# Patient Record
Sex: Male | Born: 1960 | Race: White | Hispanic: No | Marital: Single | Smoking: Current every day smoker
Health system: Southern US, Community
[De-identification: ages and names within clinical notes are randomized; demographics above are authoritative.]

## PROBLEM LIST (undated history)

## (undated) DIAGNOSIS — K219 Gastro-esophageal reflux disease without esophagitis: Secondary | ICD-10-CM

## (undated) DIAGNOSIS — R112 Nausea with vomiting, unspecified: Secondary | ICD-10-CM

## (undated) DIAGNOSIS — Z9889 Other specified postprocedural states: Secondary | ICD-10-CM

## (undated) HISTORY — PX: FRACTURE SURGERY: SHX138

---

## 2005-05-24 HISTORY — PX: URETHRAL STRICTURE DILATATION: SHX477

## 2020-06-12 ENCOUNTER — Emergency Department: Payer: Self-pay

## 2020-06-12 ENCOUNTER — Encounter: Payer: Self-pay | Admitting: Emergency Medicine

## 2020-06-12 ENCOUNTER — Inpatient Hospital Stay
Admission: EM | Admit: 2020-06-12 | Discharge: 2020-06-16 | DRG: 481 | Disposition: A | Discharge status: Home / Self Care | Payer: Self-pay | Attending: Surgery | Admitting: Surgery

## 2020-06-12 ENCOUNTER — Other Ambulatory Visit: Payer: Self-pay

## 2020-06-12 DIAGNOSIS — M1611 Unilateral primary osteoarthritis, right hip: Secondary | ICD-10-CM | POA: Diagnosis present

## 2020-06-12 DIAGNOSIS — Z79899 Other long term (current) drug therapy: Secondary | ICD-10-CM

## 2020-06-12 DIAGNOSIS — R509 Fever, unspecified: Secondary | ICD-10-CM

## 2020-06-12 DIAGNOSIS — D62 Acute posthemorrhagic anemia: Secondary | ICD-10-CM | POA: Diagnosis not present

## 2020-06-12 DIAGNOSIS — K219 Gastro-esophageal reflux disease without esophagitis: Secondary | ICD-10-CM | POA: Diagnosis present

## 2020-06-12 DIAGNOSIS — Z7902 Long term (current) use of antithrombotics/antiplatelets: Secondary | ICD-10-CM

## 2020-06-12 DIAGNOSIS — S72001A Fracture of unspecified part of neck of right femur, initial encounter for closed fracture: Secondary | ICD-10-CM

## 2020-06-12 DIAGNOSIS — S72141A Displaced intertrochanteric fracture of right femur, initial encounter for closed fracture: Principal | ICD-10-CM | POA: Diagnosis present

## 2020-06-12 DIAGNOSIS — Z419 Encounter for procedure for purposes other than remedying health state, unspecified: Secondary | ICD-10-CM

## 2020-06-12 DIAGNOSIS — W010XXA Fall on same level from slipping, tripping and stumbling without subsequent striking against object, initial encounter: Secondary | ICD-10-CM | POA: Diagnosis present

## 2020-06-12 DIAGNOSIS — Z79891 Long term (current) use of opiate analgesic: Secondary | ICD-10-CM

## 2020-06-12 DIAGNOSIS — Z20822 Contact with and (suspected) exposure to covid-19: Secondary | ICD-10-CM | POA: Diagnosis present

## 2020-06-12 DIAGNOSIS — F17210 Nicotine dependence, cigarettes, uncomplicated: Secondary | ICD-10-CM | POA: Diagnosis present

## 2020-06-12 HISTORY — DX: Gastro-esophageal reflux disease without esophagitis: K21.9

## 2020-06-12 HISTORY — DX: Nausea with vomiting, unspecified: Z98.890

## 2020-06-12 HISTORY — DX: Other specified postprocedural states: R11.2

## 2020-06-12 LAB — COMPREHENSIVE METABOLIC PANEL
ALT: 21 U/L (ref 0–44)
AST: 26 U/L (ref 15–41)
Albumin: 3.9 g/dL (ref 3.5–5.0)
Alkaline Phosphatase: 78 U/L (ref 38–126)
Anion gap: 13 (ref 5–15)
BUN: 19 mg/dL (ref 6–20)
CO2: 22 mmol/L (ref 22–32)
Calcium: 9.3 mg/dL (ref 8.9–10.3)
Chloride: 103 mmol/L (ref 98–111)
Creatinine, Ser: 0.88 mg/dL (ref 0.61–1.24)
GFR, Estimated: >60 mL/min (ref >60)
Glucose, Bld: 134 mg/dL — ABNORMAL HIGH (ref 70–99)
Potassium: 3.6 mmol/L (ref 3.5–5.1)
Sodium: 138 mmol/L (ref 135–145)
Total Bilirubin: 0.7 mg/dL (ref 0.3–1.2)
Total Protein: 7.4 g/dL (ref 6.5–8.1)

## 2020-06-12 LAB — CBC WITH DIFFERENTIAL/PLATELET
Abs Immature Granulocytes: 0.07 10*3/uL (ref 0.00–0.07)
Basophils Absolute: 0.1 10*3/uL (ref 0.0–0.1)
Basophils Relative: 0 %
Eosinophils Absolute: 0.4 10*3/uL (ref 0.0–0.5)
Eosinophils Relative: 3 %
HCT: 40.4 % (ref 39.0–52.0)
Hemoglobin: 13.5 g/dL (ref 13.0–17.0)
Immature Granulocytes: 1 %
Lymphocytes Relative: 17 %
Lymphs Abs: 2.3 10*3/uL (ref 0.7–4.0)
MCH: 31.3 pg (ref 26.0–34.0)
MCHC: 33.4 g/dL (ref 30.0–36.0)
MCV: 93.5 fL (ref 80.0–100.0)
Monocytes Absolute: 0.7 10*3/uL (ref 0.1–1.0)
Monocytes Relative: 5 %
Neutro Abs: 10.2 10*3/uL — ABNORMAL HIGH (ref 1.7–7.7)
Neutrophils Relative %: 74 %
Platelets: 279 10*3/uL (ref 150–400)
RBC: 4.32 MIL/uL (ref 4.22–5.81)
RDW: 12.1 % (ref 11.5–15.5)
WBC: 13.7 10*3/uL — ABNORMAL HIGH (ref 4.0–10.5)
nRBC: 0 % (ref 0.0–0.2)

## 2020-06-12 LAB — PROTIME-INR
INR: 1.1 (ref 0.8–1.2)
Prothrombin Time: 13.5 seconds (ref 11.4–15.2)

## 2020-06-12 LAB — SARS CORONAVIRUS 2 BY RT PCR (HOSPITAL ORDER, PERFORMED IN ~~LOC~~ HOSPITAL LAB): SARS Coronavirus 2: NEGATIVE

## 2020-06-12 MED ORDER — SODIUM CHLORIDE 0.9 % IV SOLN: INTRAVENOUS | Status: DC

## 2020-06-12 MED ORDER — ACETAMINOPHEN 325 MG PO TABS: 650.0000 mg | ORAL_TABLET | Freq: Four times a day (QID) | ORAL | Status: DC | PRN

## 2020-06-12 MED ORDER — ACETAMINOPHEN 650 MG RE SUPP: 650.0000 mg | Freq: Four times a day (QID) | RECTAL | Status: DC | PRN

## 2020-06-12 MED ORDER — OXYCODONE HCL 5 MG PO TABS: 5.0000 mg | ORAL_TABLET | ORAL | Status: DC | PRN

## 2020-06-12 MED ORDER — MAGNESIUM HYDROXIDE 400 MG/5ML PO SUSP: 30.0000 mL | Freq: Every day | ORAL | Status: DC | PRN

## 2020-06-12 MED ORDER — HYDROMORPHONE HCL 1 MG/ML IJ SOLN
1.0000 mg | Freq: Once | INTRAMUSCULAR | Status: AC
Start: 2020-06-12 — End: 2020-06-12
  Administered 2020-06-12: 1 mg via INTRAVENOUS
  Filled 2020-06-12: qty 1

## 2020-06-12 MED ORDER — FLEET ENEMA 7-19 GM/118ML RE ENEM: 1.0000 | ENEMA | Freq: Once | RECTAL | Status: DC | PRN

## 2020-06-12 MED ORDER — PANTOPRAZOLE SODIUM 40 MG IV SOLR
40.0000 mg | Freq: Every day | INTRAVENOUS | Status: DC
  Administered 2020-06-12: 40 mg via INTRAVENOUS
  Filled 2020-06-12: qty 40

## 2020-06-12 MED ORDER — CEFAZOLIN SODIUM-DEXTROSE 2-4 GM/100ML-% IV SOLN
2.0000 g | INTRAVENOUS | Status: AC
  Administered 2020-06-13: 2 g via INTRAVENOUS
  Filled 2020-06-12: qty 100

## 2020-06-12 MED ORDER — ACETAMINOPHEN 500 MG PO TABS
1000.0000 mg | ORAL_TABLET | Freq: Once | ORAL | Status: AC
  Administered 2020-06-12: 1000 mg via ORAL
  Filled 2020-06-12: qty 2

## 2020-06-12 MED ORDER — BISACODYL 10 MG RE SUPP
10.0000 mg | Freq: Every day | RECTAL | Status: DC | PRN
  Filled 2020-06-12: qty 1

## 2020-06-12 MED ORDER — DOCUSATE SODIUM 100 MG PO CAPS
100.0000 mg | ORAL_CAPSULE | Freq: Two times a day (BID) | ORAL | Status: DC
  Administered 2020-06-12: 100 mg via ORAL
  Filled 2020-06-12: qty 1

## 2020-06-12 MED ORDER — HYDROMORPHONE HCL 1 MG/ML IJ SOLN
0.5000 mg | INTRAMUSCULAR | Status: DC | PRN
  Administered 2020-06-12 – 2020-06-13 (×6): 1 mg via INTRAVENOUS
  Filled 2020-06-12 (×6): qty 1

## 2020-06-12 NOTE — ED Provider Notes (Addendum)
[facility] Emergency Department Provider Note  ____________________________________________   Event Date/Time   First MD Initiated Contact with Patient 06/12/20 1517     (approximate)  I have reviewed the triage vital signs and the nursing notes.   HISTORY  Chief Complaint Hip Pain   HPI Phillip Washington is a 60 y.o. male with a history of chronic right hip pain who presents for assessment of acute onset of right hip pain after he slipped while taking out some garbage earlier today.  Patient states he has extreme pain in his right hip and has been able to bear weight or move it.  He denies any other acute pain in his extremities chest abdomen head neck or pelvis.  He denies hitting his head or any LOC.  He denies taking blood thinners.  He states that otherwise he has been in his usual state health without any recent fevers, chills, cough, nausea, vomiting, diarrhea, dysuria, rash or other recent injuries or falls.  No other acute concerns at this time.         History reviewed. No pertinent past medical history.  There are no problems to display for this patient.   History reviewed. No pertinent surgical history.  Prior to Admission medications   Not on File    Allergies Patient has no known allergies.  History reviewed. No pertinent family history.  Social History Social History   Tobacco Use  . Smoking status: Current Every Day Smoker  . Smokeless tobacco: Never Used  Substance Use Topics  . Alcohol use: Yes  . Drug use: Not Currently    Review of Systems  Review of Systems  Constitutional: Negative for chills and fever.  HENT: Negative for sore throat.   Eyes: Negative for pain.  Respiratory: Negative for cough and stridor.   Cardiovascular: Negative for chest pain.  Gastrointestinal: Negative for vomiting.  Genitourinary: Negative for dysuria.  Musculoskeletal: Positive for joint pain ( R hip).  Skin: Negative for rash.   Neurological: Negative for seizures, loss of consciousness and headaches.  Psychiatric/Behavioral: Negative for suicidal ideas.  All other systems reviewed and are negative.     ____________________________________________   PHYSICAL EXAM:  VITAL SIGNS: ED Triage Vitals  Enc Vitals Group     BP 06/12/20 1509 137/86     Pulse Rate 06/12/20 1509 68     Resp 06/12/20 1509 20     Temp --      Temp Source 06/12/20 1509 Oral     SpO2 06/12/20 1509 99 %     Weight 06/12/20 1511 230 lb (104.3 kg)     Height 06/12/20 1511 6\' 1"  (1.854 m)     Head Circumference --      Peak Flow --      Pain Score 06/12/20 1510 10     Pain Loc --      Pain Edu? --      Excl. in GC? --    Vitals:   06/12/20 1509 06/12/20 1639  BP: 137/86 126/81  Pulse: 68 80  Resp: 20 18  SpO2: 99% 96%   Physical Exam Vitals and nursing note reviewed.  Constitutional:      Appearance: He is well-developed and well-nourished.  HENT:     Head: Normocephalic and atraumatic.  Eyes:     Conjunctiva/sclera: Conjunctivae normal.  Cardiovascular:     Rate and Rhythm: Normal rate and regular rhythm.     Heart sounds: No murmur heard.  Pulmonary:     Effort: Pulmonary effort is normal. No respiratory distress.     Breath sounds: Normal breath sounds.  Abdominal:     Palpations: Abdomen is soft.     Tenderness: There is no abdominal tenderness. There is no right CVA tenderness or left CVA tenderness.  Musculoskeletal:        General: No edema.     Cervical back: Neck supple.     Right hip: Bony tenderness present. Decreased range of motion. Decreased strength.  Skin:    General: Skin is warm and dry.  Neurological:     Mental Status: He is alert.  Psychiatric:        Mood and Affect: Mood and affect normal.     2+ bilateral radial and DP pulses.  Patient has full strength at the right knee and right foot as well as throughout the left lower extremity and bowel upper extremities.  No tenderness  step-offs deformities over the C/T/L-spine.  Head and neck chest and abdomen are unremarkable. ____________________________________________   LABS (all labs ordered are listed, but only abnormal results are displayed)  Labs Reviewed  CBC WITH DIFFERENTIAL/PLATELET - Abnormal; Notable for the following components:      Result Value   WBC 13.7 (*)    Neutro Abs 10.2 (*)    All other components within normal limits  COMPREHENSIVE METABOLIC PANEL - Abnormal; Notable for the following components:   Glucose, Bld 134 (*)    All other components within normal limits  SARS CORONAVIRUS 2 BY RT PCR (HOSPITAL ORDER, PERFORMED IN [facility])  PROTIME-INR  TYPE AND SCREEN   ____________________________________________  EKG  Sinus rhythm with a ventricular rate of 71, right axis, unremarkable intervals, some artifact but no clear evidence of acute ischemia or other significant underlying arrhythmia. ____________________________________________  RADIOLOGY  ED MD interpretation: Chest x-ray has no evidence of pneumonia, pneumothorax, effusion, edema, or other acute thoracic process.  Right hip plain film shows evidence of acute comminuted right intertrochanteric fracture.  Official radiology report(s): DG Chest 1 View  Result Date: 06/12/2020 CLINICAL DATA:  Fall EXAM: CHEST  1 VIEW COMPARISON:  None. FINDINGS: The heart size and mediastinal contours are within normal limits. Both lungs are clear. The visualized skeletal structures are unremarkable. IMPRESSION: No active disease. Electronically Signed   By: Jasmine Pang M.D.   On: 06/12/2020 16:48   DG Hip Unilat  With Pelvis 2-3 Views Right  Result Date: 06/12/2020 CLINICAL DATA:  Hip pain after fall EXAM: DG HIP (WITH OR WITHOUT PELVIS) 2-3V RIGHT COMPARISON:  None. FINDINGS: SI joints are non widened. Pubic symphysis and rami appear intact. Mild degenerative changes of the left hip. Acute comminuted right intertrochanteric  fracture with displaced lesser trochanteric fracture fragment. Advanced arthritis of the right hip. Right femoral head projects in joint IMPRESSION: Acute comminuted right intertrochanteric fracture. Electronically Signed   By: Jasmine Pang M.D.   On: 06/12/2020 16:48    ____________________________________________   PROCEDURES  Procedure(s) performed (including Critical Care):  .1-3 Lead EKG Interpretation Performed by: Gilles Chiquito, MD Authorized by: Gilles Chiquito, MD     Interpretation: normal     ECG rate assessment: normal     Rhythm: sinus rhythm     Ectopy: none     Conduction: normal       ____________________________________________   INITIAL IMPRESSION / ASSESSMENT AND PLAN / ED COURSE      Patient presents for assessment of acute  right hip pain after a ground-level mechanical fall described above.  On arrival he is hemodynamically stable.  He is very weak and tender at the right hip.  He is neurovascular intact distally.  No evidence on history or exam of acute infectious process or injury to any other joints.  Routine labs obtained show mild leukocytosis which I suspect is reactive in the setting of trauma.  Normal hemoglobin.  CMP shows no significant electrolyte or metabolic derangements.  INR is unremarkable.  Low suspicion for other occult or significant visceral injury at this time.  I did discuss with on-call orthopedist [name] he stated to admit the patient for further evaluation and management.  ____________________________________________   FINAL CLINICAL IMPRESSION(S) / ED DIAGNOSES  Final diagnoses:  Closed fracture of right hip, initial encounter (HCC)    Medications  HYDROmorphone (DILAUDID) injection 1 mg (1 mg Intravenous Given 06/12/20 1559)  acetaminophen (TYLENOL) tablet 1,000 mg (1,000 mg Oral Given 06/12/20 1652)     ED Discharge Orders    None       Note:  This document was prepared using Dragon voice recognition  software and may include unintentional dictation errors.   Gilles Chiquito, MD 06/12/20 1722    Gilles Chiquito, MD 06/12/20 [phone]

## 2020-06-12 NOTE — ED Triage Notes (Signed)
Pt comes into the ED via POV c/o right hip pain after slipping on ice while taking garbage out.  Pt has shortening noted to the leg at this time and is completely nonweightbearing. Pt has even and unlabored respirations.

## 2020-06-12 NOTE — ED Notes (Signed)
Pt given water to drink per request.  Readjusted in the bed with towel and pillows propping up patient to comfortable spot.

## 2020-06-12 NOTE — ED Notes (Signed)
Patient to xray at this time

## 2020-06-13 ENCOUNTER — Inpatient Hospital Stay: Payer: Self-pay

## 2020-06-13 ENCOUNTER — Inpatient Hospital Stay: Payer: Self-pay | Admitting: Anesthesiology

## 2020-06-13 ENCOUNTER — Other Ambulatory Visit: Payer: Self-pay

## 2020-06-13 ENCOUNTER — Encounter
Admission: EM | Disposition: A | Discharge status: Home / Self Care | Payer: Self-pay | Source: Home / Self Care | Attending: Surgery

## 2020-06-13 ENCOUNTER — Encounter: Payer: Self-pay | Admitting: Surgery

## 2020-06-13 HISTORY — PX: INTRAMEDULLARY (IM) NAIL INTERTROCHANTERIC: SHX5875

## 2020-06-13 LAB — HIV ANTIBODY (ROUTINE TESTING W REFLEX): HIV Screen 4th Generation wRfx: NONREACTIVE

## 2020-06-13 SURGERY — FIXATION, FRACTURE, INTERTROCHANTERIC, WITH INTRAMEDULLARY ROD
Anesthesia: Monitor Anesthesia Care | Site: Hip | Laterality: Right

## 2020-06-13 MED ORDER — PROPOFOL 500 MG/50ML IV EMUL
INTRAVENOUS | Status: DC | PRN
  Administered 2020-06-13: 75 ug/kg/min via INTRAVENOUS

## 2020-06-13 MED ORDER — ACETAMINOPHEN 10 MG/ML IV SOLN
INTRAVENOUS | Status: DC | PRN
  Administered 2020-06-13: 1000 mg via INTRAVENOUS

## 2020-06-13 MED ORDER — BUPIVACAINE HCL (PF) 0.5 % IJ SOLN
INTRAMUSCULAR | Status: AC
  Filled 2020-06-13: qty 10

## 2020-06-13 MED ORDER — NEOMYCIN-POLYMYXIN B GU 40-200000 IR SOLN
Status: AC
  Filled 2020-06-13: qty 4

## 2020-06-13 MED ORDER — LACTATED RINGERS IV SOLN
INTRAVENOUS | Status: DC
  Administered 2020-06-13: 100 mL/h via INTRAVENOUS

## 2020-06-13 MED ORDER — CEFAZOLIN SODIUM-DEXTROSE 2-4 GM/100ML-% IV SOLN
2.0000 g | Freq: Four times a day (QID) | INTRAVENOUS | Status: AC
  Administered 2020-06-14 (×2): 2 g via INTRAVENOUS
  Filled 2020-06-13 (×3): qty 100

## 2020-06-13 MED ORDER — ONDANSETRON HCL 4 MG/2ML IJ SOLN
INTRAMUSCULAR | Status: AC
  Filled 2020-06-13: qty 2

## 2020-06-13 MED ORDER — SENNOSIDES-DOCUSATE SODIUM 8.6-50 MG PO TABS: 1.0000 | ORAL_TABLET | Freq: Every evening | ORAL | Status: DC | PRN

## 2020-06-13 MED ORDER — BUPIVACAINE LIPOSOME 1.3 % IJ SUSP
INTRAMUSCULAR | Status: AC
  Filled 2020-06-13: qty 20

## 2020-06-13 MED ORDER — ACETAMINOPHEN 10 MG/ML IV SOLN
INTRAVENOUS | Status: AC
  Filled 2020-06-13: qty 100

## 2020-06-13 MED ORDER — DOCUSATE SODIUM 100 MG PO CAPS
100.0000 mg | ORAL_CAPSULE | Freq: Two times a day (BID) | ORAL | Status: DC
  Administered 2020-06-13 – 2020-06-16 (×6): 100 mg via ORAL
  Filled 2020-06-13 (×6): qty 1

## 2020-06-13 MED ORDER — CEFAZOLIN SODIUM-DEXTROSE 2-4 GM/100ML-% IV SOLN
INTRAVENOUS | Status: AC
  Administered 2020-06-13: 2 g via INTRAVENOUS
  Filled 2020-06-13: qty 100

## 2020-06-13 MED ORDER — HYDROMORPHONE HCL 1 MG/ML IJ SOLN
0.2500 mg | INTRAMUSCULAR | Status: DC | PRN
  Administered 2020-06-13 – 2020-06-14 (×3): 0.5 mg via INTRAVENOUS
  Filled 2020-06-13 (×3): qty 1

## 2020-06-13 MED ORDER — TRAMADOL HCL 50 MG PO TABS
50.0000 mg | ORAL_TABLET | Freq: Four times a day (QID) | ORAL | Status: DC | PRN
Start: 2020-06-13 — End: 2020-06-16
  Administered 2020-06-14: 50 mg via ORAL
  Filled 2020-06-13 (×2): qty 1

## 2020-06-13 MED ORDER — NEOMYCIN-POLYMYXIN B GU 40-200000 IR SOLN
Status: DC | PRN
Start: 2020-06-13 — End: 2020-06-13
  Administered 2020-06-13: 4 mL

## 2020-06-13 MED ORDER — BUPIVACAINE HCL (PF) 0.5 % IJ SOLN
INTRAMUSCULAR | Status: DC | PRN
  Administered 2020-06-13: 30 mL

## 2020-06-13 MED ORDER — BUPIVACAINE HCL (PF) 0.25 % IJ SOLN
INTRAMUSCULAR | Status: AC
  Filled 2020-06-13: qty 30

## 2020-06-13 MED ORDER — FENTANYL CITRATE (PF) 100 MCG/2ML IJ SOLN
INTRAMUSCULAR | Status: AC
  Filled 2020-06-13: qty 2

## 2020-06-13 MED ORDER — ONDANSETRON HCL 4 MG/2ML IJ SOLN: 4.0000 mg | Freq: Four times a day (QID) | INTRAMUSCULAR | Status: DC | PRN

## 2020-06-13 MED ORDER — METOCLOPRAMIDE HCL 5 MG/ML IJ SOLN: 5.0000 mg | Freq: Three times a day (TID) | INTRAMUSCULAR | Status: DC | PRN

## 2020-06-13 MED ORDER — ENOXAPARIN SODIUM 40 MG/0.4ML ~~LOC~~ SOLN
40.0000 mg | SUBCUTANEOUS | Status: DC
  Administered 2020-06-14 – 2020-06-16 (×2): 40 mg via SUBCUTANEOUS
  Filled 2020-06-13 (×2): qty 0.4

## 2020-06-13 MED ORDER — PHENYLEPHRINE HCL (PRESSORS) 10 MG/ML IV SOLN
INTRAVENOUS | Status: DC | PRN
  Administered 2020-06-13: 300 ug via INTRAVENOUS
  Administered 2020-06-13: 200 ug via INTRAVENOUS
  Administered 2020-06-13: 300 ug via INTRAVENOUS
  Administered 2020-06-13: 200 ug via INTRAVENOUS

## 2020-06-13 MED ORDER — OXYCODONE HCL 5 MG PO TABS
5.0000 mg | ORAL_TABLET | ORAL | Status: DC | PRN
  Administered 2020-06-14 – 2020-06-15 (×2): 5 mg via ORAL
  Administered 2020-06-15: 10 mg via ORAL
  Administered 2020-06-15: 5 mg via ORAL
  Administered 2020-06-15: 10 mg via ORAL
  Administered 2020-06-16: 5 mg via ORAL
  Filled 2020-06-13 (×3): qty 2
  Filled 2020-06-13 (×2): qty 1
  Filled 2020-06-13: qty 2

## 2020-06-13 MED ORDER — EPHEDRINE SULFATE 50 MG/ML IJ SOLN
INTRAMUSCULAR | Status: DC | PRN
  Administered 2020-06-13: 10 mg via INTRAVENOUS
  Administered 2020-06-13: 15 mg via INTRAVENOUS

## 2020-06-13 MED ORDER — MIDAZOLAM HCL 5 MG/5ML IJ SOLN
INTRAMUSCULAR | Status: DC | PRN
  Administered 2020-06-13: 2 mg via INTRAVENOUS

## 2020-06-13 MED ORDER — METHOCARBAMOL 500 MG PO TABS
500.0000 mg | ORAL_TABLET | Freq: Four times a day (QID) | ORAL | Status: DC | PRN
  Administered 2020-06-14 – 2020-06-15 (×2): 500 mg via ORAL
  Filled 2020-06-13 (×2): qty 1

## 2020-06-13 MED ORDER — PROPOFOL 500 MG/50ML IV EMUL
INTRAVENOUS | Status: AC
  Filled 2020-06-13: qty 50

## 2020-06-13 MED ORDER — ONDANSETRON HCL 4 MG/2ML IJ SOLN: 4.0000 mg | Freq: Once | INTRAMUSCULAR | Status: DC | PRN

## 2020-06-13 MED ORDER — FENTANYL CITRATE (PF) 100 MCG/2ML IJ SOLN
25.0000 ug | INTRAMUSCULAR | Status: DC | PRN
Start: 2020-06-13 — End: 2020-06-13
  Administered 2020-06-13 (×2): 50 ug via INTRAVENOUS

## 2020-06-13 MED ORDER — MEPERIDINE HCL 50 MG/ML IJ SOLN: 6.2500 mg | INTRAMUSCULAR | Status: DC | PRN

## 2020-06-13 MED ORDER — MIDAZOLAM HCL 2 MG/2ML IJ SOLN
INTRAMUSCULAR | Status: AC
  Filled 2020-06-13: qty 2

## 2020-06-13 MED ORDER — OXYCODONE HCL 5 MG PO TABS
2.5000 mg | ORAL_TABLET | ORAL | Status: DC | PRN
  Administered 2020-06-14 – 2020-06-16 (×2): 5 mg via ORAL
  Filled 2020-06-13 (×2): qty 1

## 2020-06-13 MED ORDER — EPHEDRINE 5 MG/ML INJ
INTRAVENOUS | Status: AC
  Filled 2020-06-13: qty 10

## 2020-06-13 MED ORDER — FLEET ENEMA 7-19 GM/118ML RE ENEM: 1.0000 | ENEMA | Freq: Once | RECTAL | Status: DC | PRN

## 2020-06-13 MED ORDER — HYDROMORPHONE HCL 1 MG/ML IJ SOLN
INTRAMUSCULAR | Status: AC
  Filled 2020-06-13: qty 1

## 2020-06-13 MED ORDER — PROPOFOL 10 MG/ML IV BOLUS
INTRAVENOUS | Status: DC | PRN
  Administered 2020-06-13: 40 mg via INTRAVENOUS

## 2020-06-13 MED ORDER — KETOROLAC TROMETHAMINE 15 MG/ML IJ SOLN
15.0000 mg | Freq: Four times a day (QID) | INTRAMUSCULAR | Status: AC
  Administered 2020-06-13 – 2020-06-14 (×3): 15 mg via INTRAVENOUS
  Filled 2020-06-13 (×3): qty 1

## 2020-06-13 MED ORDER — ONDANSETRON HCL 4 MG PO TABS: 4.0000 mg | ORAL_TABLET | Freq: Four times a day (QID) | ORAL | Status: DC | PRN

## 2020-06-13 MED ORDER — METOCLOPRAMIDE HCL 10 MG PO TABS: 5.0000 mg | ORAL_TABLET | Freq: Three times a day (TID) | ORAL | Status: DC | PRN

## 2020-06-13 MED ORDER — SODIUM CHLORIDE 0.9 % IV SOLN
INTRAVENOUS | Status: DC | PRN
  Administered 2020-06-13: 50 ug/min via INTRAVENOUS

## 2020-06-13 MED ORDER — BUPIVACAINE HCL (PF) 0.5 % IJ SOLN
INTRAMUSCULAR | Status: AC
  Filled 2020-06-13: qty 30

## 2020-06-13 MED ORDER — BUPIVACAINE HCL (PF) 0.5 % IJ SOLN
INTRAMUSCULAR | Status: DC | PRN
  Administered 2020-06-13: 3 mL via INTRATHECAL

## 2020-06-13 MED ORDER — METHOCARBAMOL 1000 MG/10ML IJ SOLN
500.0000 mg | Freq: Four times a day (QID) | INTRAVENOUS | Status: DC | PRN
  Filled 2020-06-13: qty 5

## 2020-06-13 MED ORDER — BUPIVACAINE LIPOSOME 1.3 % IJ SUSP
INTRAMUSCULAR | Status: DC | PRN
  Administered 2020-06-13: 20 mL

## 2020-06-13 MED ORDER — BISACODYL 10 MG RE SUPP: 10.0000 mg | Freq: Every day | RECTAL | Status: DC | PRN

## 2020-06-13 MED ORDER — ACETAMINOPHEN 500 MG PO TABS
1000.0000 mg | ORAL_TABLET | Freq: Three times a day (TID) | ORAL | Status: DC
  Administered 2020-06-13 – 2020-06-16 (×8): 1000 mg via ORAL
  Filled 2020-06-13 (×8): qty 2

## 2020-06-13 MED ORDER — SODIUM CHLORIDE 0.9 % IV SOLN: INTRAVENOUS | Status: DC

## 2020-06-13 SURGICAL SUPPLY — 49 items
BIT DRILL INTERTAN LAG SCREW (BIT) ×1 IMPLANT
BIT DRILL SHORT 4.0 (BIT) IMPLANT
BNDG COHESIVE 4X5 TAN STRL (GAUZE/BANDAGES/DRESSINGS) ×2 IMPLANT
BNDG COHESIVE 6X5 TAN STRL LF (GAUZE/BANDAGES/DRESSINGS) ×2 IMPLANT
CANISTER SUCT 1200ML W/VALVE (MISCELLANEOUS) ×2 IMPLANT
CHLORAPREP W/TINT 26 (MISCELLANEOUS) ×3 IMPLANT
COVER WAND RF STERILE (DRAPES) ×2 IMPLANT
DRAPE 3/4 80X56 (DRAPES) ×3 IMPLANT
DRAPE C-ARMOR (DRAPES) ×2 IMPLANT
DRILL BIT SHORT 4.0 (BIT) ×1
DRSG OPSITE POSTOP 3X4 (GAUZE/BANDAGES/DRESSINGS) IMPLANT
DRSG OPSITE POSTOP 4X6 (GAUZE/BANDAGES/DRESSINGS) ×3 IMPLANT
ELECT CAUTERY BLADE 6.4 (BLADE) ×2 IMPLANT
ELECT REM PT RETURN 9FT ADLT (ELECTROSURGICAL) ×2
ELECTRODE REM PT RTRN 9FT ADLT (ELECTROSURGICAL) ×1 IMPLANT
GAUZE SPONGE 4X4 12PLY STRL (GAUZE/BANDAGES/DRESSINGS) ×2 IMPLANT
GLOVE BIO SURGEON STRL SZ8 (GLOVE) ×4 IMPLANT
GLOVE INDICATOR 8.0 STRL GRN (GLOVE) ×2 IMPLANT
GOWN STRL REUS W/ TWL LRG LVL3 (GOWN DISPOSABLE) ×1 IMPLANT
GOWN STRL REUS W/ TWL XL LVL3 (GOWN DISPOSABLE) ×1 IMPLANT
GOWN STRL REUS W/TWL LRG LVL3 (GOWN DISPOSABLE) ×1
GOWN STRL REUS W/TWL XL LVL3 (GOWN DISPOSABLE) ×1
GUIDE PIN 3.2X343 (PIN) ×2
GUIDE PIN 3.2X343MM (PIN) ×2
GUIDE ROD 3.0 (MISCELLANEOUS) ×2
HOLDER FOLEY CATH W/STRAP (MISCELLANEOUS) IMPLANT
MANIFOLD NEPTUNE II (INSTRUMENTS) ×2 IMPLANT
MAT ABSORB  FLUID 56X50 GRAY (MISCELLANEOUS) ×1
MAT ABSORB FLUID 56X50 GRAY (MISCELLANEOUS) ×1 IMPLANT
NAIL LOCK CANN 10X420 130D RT (Nail) ×1 IMPLANT
NDL FILTER BLUNT 18X1 1/2 (NEEDLE) ×1 IMPLANT
NEEDLE FILTER BLUNT 18X 1/2SAF (NEEDLE) ×1
NEEDLE FILTER BLUNT 18X1 1/2 (NEEDLE) ×1 IMPLANT
NEEDLE HYPO 22GX1.5 SAFETY (NEEDLE) ×2 IMPLANT
NS IRRIG 500ML POUR BTL (IV SOLUTION) ×2 IMPLANT
PACK HIP COMPR (MISCELLANEOUS) ×2 IMPLANT
PIN GUIDE 3.2X343MM (PIN) IMPLANT
ROD GUIDE 3.0 (MISCELLANEOUS) IMPLANT
SCREW LAG COMPR KIT 100/95 (Screw) ×1 IMPLANT
SCREW TRIGEN LOW PROF 5.0X42.5 (Screw) ×1 IMPLANT
STAPLER SKIN PROX 35W (STAPLE) ×2 IMPLANT
STRAP SAFETY 5IN WIDE (MISCELLANEOUS) ×2 IMPLANT
SUT VIC AB 0 CT1 36 (SUTURE) ×2 IMPLANT
SUT VIC AB 1 CT1 36 (SUTURE) ×2 IMPLANT
SUT VIC AB 2-0 CT1 (SUTURE) ×3 IMPLANT
SYR 10ML LL (SYRINGE) ×2 IMPLANT
SYR 30ML LL (SYRINGE) ×2 IMPLANT
TAPE MICROFOAM 4IN (TAPE) ×2 IMPLANT
TRAY FOLEY SLVR 16FR LF STAT (SET/KITS/TRAYS/PACK) IMPLANT

## 2020-06-13 NOTE — Anesthesia Preprocedure Evaluation (Signed)
Anesthesia Evaluation  Patient identified by MRN, date of birth, ID band Patient awake    Reviewed: Allergy & Precautions, H&P , NPO status , Patient's Chart, lab work & pertinent test results  Airway Mallampati: II  TM Distance: >3 FB Neck ROM: Full    Dental  (+) Poor Dentition   Pulmonary neg pulmonary ROS, Current Smoker and Patient abstained from smoking.,    Pulmonary exam normal        Cardiovascular negative cardio ROS Normal cardiovascular exam     Neuro/Psych negative neurological ROS  negative psych ROS   GI/Hepatic negative GI ROS, Neg liver ROS,   Endo/Other  negative endocrine ROS  Renal/GU negative Renal ROS  negative genitourinary   Musculoskeletal negative musculoskeletal ROS (+)   Abdominal   Peds negative pediatric ROS (+)  Hematology negative hematology ROS (+)   Anesthesia Other Findings   Reproductive/Obstetrics negative OB ROS                            Anesthesia Physical Anesthesia Plan  ASA: II  Anesthesia Plan: MAC and Spinal   Post-op Pain Management:    Induction: Intravenous  PONV Risk Score and Plan: 2  Airway Management Planned: Mask  Additional Equipment:   Intra-op Plan:   Post-operative Plan:   Informed Consent: I have reviewed the patients History and Physical, chart, labs and discussed the procedure including the risks, benefits and alternatives for the proposed anesthesia with the patient or authorized representative who has indicated his/her understanding and acceptance.       Plan Discussed with: CRNA and Surgeon  Anesthesia Plan Comments:         Anesthesia Quick Evaluation

## 2020-06-13 NOTE — H&P (Signed)
H&P reviewed. No significant changes noted. I will be taking over care of this patient from [name], and I will plan to perform R hip intramedullary nailing.

## 2020-06-13 NOTE — H&P (Signed)
Subjective:  Chief complaint: Right hip pain.  The patient is a 60 y.o. male who sustained an injury to the right hip yesterday afternoon.  Apparently he was taking the garbage out to the dumpster when he slipped on a patch of black ice and landed on his right hip.  He noted immediate pain and was unable to ambulate.  He was brought to the emergency room where x-rays demonstrated a displaced intertrochanteric fracture of the right hip.  The patient denies any associated injury. The patient did not strike his head or lose consciousness. The patient also denies any light-headedness, dizziness, chest pain, or shortness of breath which might have contributed to the injury.  The patient works as a Corporate investment banker.  He does note that he has had progressively worsening pain in his right hip for several years and has been diagnosed with degenerative joint disease.  In fact, he was scheduled to see an orthopedic surgeon at [facility] on Monday to discuss hip replacement surgery.  Patient Active Problem List   Diagnosis Date Noted  . Intertrochanteric fracture of right hip (HCC) 06/12/2020   History reviewed. No pertinent past medical history.  History reviewed. No pertinent surgical history.  (Not in a hospital admission)  No Known Allergies  Social History   Tobacco Use  . Smoking status: Current Every Day Smoker  . Smokeless tobacco: Never Used  Substance Use Topics  . Alcohol use: Yes    History reviewed. No pertinent family history.   Review of Systems: As noted above. The patient denies any chest pain, shortness of breath, nausea, vomiting, diarrhea, constipation, belly pain, blood in his/her stool, or burning with urination.  Objective: Temp:  [98 F (36.7 C)] 98 F (36.7 C) (01/21 0728) Pulse Rate:  [66-92] 67 (01/21 0728) Resp:  [14-20] 16 (01/21 0728) BP: (100-137)/(62-86) 118/72 (01/21 0728) SpO2:  [92 %-99 %] 96 % (01/21 0728) Weight:  [104.3 kg] 104.3 kg (01/20 1511)  Physical  Exam: General:  Alert, no acute distress Psychiatric:  Patient is competent for consent with normal mood and affect Cardiovascular:  RRR  Respiratory:  Clear to auscultation. No wheezing. Non-labored breathing GI:  Abdomen is soft and non-tender Skin:  No lesions in the area of chief complaint Neurologic:  Sensation intact distally Lymphatic:  No axillary or cervical lymphadenopathy  Orthopedic Exam:  Orthopedic examination is limited to the right hip and lower extremity.  The right lower extremity somewhat shortened and externally rotated as compared to the left.  Skin inspection around the right hip is unremarkable.  No swelling, erythema, ecchymosis, abrasions, or other skin abnormalities are identified.  He has some mild tenderness to palpation over the lateral aspect of the right hip.  He has more severe pain with any attempted active or passive motion of the hip.  He is able to dorsiflex and plantarflex his toes and ankle.  Sensation is intact to light touch to all distributions.  He has good capillary refill to his right foot.  Imaging Review: Recent x-rays of the pelvis and right hip are available for review and have been reviewed by myself.  These films demonstrate a mildly displaced three-part intertrochanteric fracture of the right hip.  There also is evidence of at least moderate degenerative changes of the right hip as manifest by superior joint space narrowing and femoral osteophyte formation.  No lytic lesions or other acute bony abnormalities are identified.  Assessment: Closed displaced intertrochanteric right hip fracture with underlying degenerative joint disease.  Plan: The treatment options, including both surgical and nonsurgical choices, have been discussed in detail with the patient and his wife. The patient would like to proceed with surgical intervention to include an intramedullary nailing of the right hip fracture. I explained to the patient that we need to get this  fracture to heal first before he can start to think about getting a hip replacement. The risks (including bleeding, infection, nerve and/or blood vessel injury, persistent or recurrent pain, malunion and/or nonunion, leg length inequality, persistent/worsening degenerative joint disease, need for further surgery, blood clots, strokes, heart attacks or arrhythmias, pneumonia, etc.) and benefits of the surgical procedure were discussed. The patient states his understanding and agrees to proceed. A formal written consent will be obtained by the nursing staff.

## 2020-06-13 NOTE — Transfer of Care (Signed)
Immediate Anesthesia Transfer of Care Note  Patient: Phillip Washington  Procedure(s) Performed: INTRAMEDULLARY (IM) NAIL INTERTROCHANTRIC (Right Hip)  Patient Location: PACU  Anesthesia Type:Spinal  Level of Consciousness: awake, alert  and oriented  Airway & Oxygen Therapy: Patient Spontanous Breathing and Patient connected to face mask oxygen  Post-op Assessment: Report given to RN and Post -op Vital signs reviewed and stable  Post vital signs: Reviewed and stable  Last Vitals:  Vitals Value Taken Time  BP    Temp    Pulse    Resp    SpO2      Last Pain:  Vitals:   06/13/20 1241  TempSrc:   PainSc: 8          Complications: No complications documented.

## 2020-06-13 NOTE — Anesthesia Procedure Notes (Signed)
Spinal  Patient location during procedure: OR Start time: 06/13/2020 1:34 PM End time: 06/13/2020 1:42 PM Staffing Performed: resident/CRNA  Anesthesiologist: Manfred Arch, MD Resident/CRNA: Lynden Oxford, CRNA Preanesthetic Checklist Completed: patient identified, IV checked, site marked, risks and benefits discussed, surgical consent, monitors and equipment checked, pre-op evaluation and timeout performed Spinal Block Patient position: sitting Prep: DuraPrep Patient monitoring: heart rate, cardiac monitor, continuous pulse ox and blood pressure Approach: midline Location: L3-4 Injection technique: single-shot Needle Needle type: Pencan  Needle gauge: 25 G Needle length: 9 cm Assessment Sensory level: T4

## 2020-06-13 NOTE — Op Note (Signed)
DATE OF SURGERY: 06/13/2020  PREOPERATIVE DIAGNOSIS: Right intertrochanteric hip fracture  POSTOPERATIVE DIAGNOSIS: Right intertrochanteric hip fracture  PROCEDURE: Intramedullary nailing of Right femur with cephalomedullary device  SURGEON: Rosealee Albee, MD  ANESTHESIA: spinal  EBL: 150 cc  IVF: per anesthesia record  COMPONENTS:  [employer] Intertan Long Nail: 10x431mm; lag screw with 29mm compression screw; 5x 42.84mm distal cortical interlocking screw  INDICATIONS: Phillip Washington is a 60 y.o. male who sustained an intertrochanteric fracture after a fall. Risks and benefits of intramedullary nailing were explained to the patient and/or family . Risks include but are not limited to bleeding, infection, injury to tissues, nerves, vessels, nonunion/malunion, hardware failure, limb length discrepancy/hip rotation mismatch and risks of anesthesia. The patient and/or family understand these risks, have completed an informed consent, and wish to proceed. Of note, he has end-stage degenerative changes to the right hip joint and he was considering undergoing total hip arthroplasty prior to injury. We did discuss that his fracture would ideally heal prior to undergoing THA.    PROCEDURE:  The patient was brought into the operating room. After administering anesthesia, the patient was placed in the supine position on the Hana table. The uninjured leg was placed in an extended position while the injured lower extremity was placed in longitudinal traction. The fracture was reduced using longitudinal traction and internal rotation. The adequacy of reduction was verified fluoroscopically in AP and lateral projections and found to be acceptable on the lateral view but slightly displaced on the AP view. The lateral aspect of the hip and thigh were prepped with ChloraPrep solution before being draped sterilely. Preoperative IV antibiotics were administered. A timeout was performed to verify the  appropriate surgical site, patient, and procedure.    The greater trochanter was identified and an approximately 6 cm incision was made about 3 fingerbreadths above the tip of the greater trochanter. The incision was carried down through the subcutaneous tissues to expose the gluteal fascia. This was split the length of the incision, providing access to the tip of the trochanter. Under fluoroscopic guidance, a guidewire was drilled through the tip of the trochanter into the proximal metaphysis to the level of the lesser trochanter. After verifying its position fluoroscopically in AP and lateral projections, it was overreamed with the opening reamer to the level of the lesser trochanter. A guidewire was passed down through the femoral canal to the supracondylar region.  Next, on incision distal to the vastus ridge on the lateral aspect of the femur was made.  IT band was split sharply as well as the vastus lateralis.  A bone hook was placed along the medial aspect of the femoral neck fragment and pulled laterally.  This achieved improved medial calcar overlap.  Next, while holding the fracture in this reduced position, the guidewire was overreamed sequentially using the flexible reamers. The nail was selected and advanced to the appropriate depth as verified fluoroscopically.    The guide system for the lag screw was positioned and advanced through the previously made lateral femoral incision.  While holding the bone hook in place, the guidewire was drilled up through the femoral nail and into the femoral neck to rest within 5 mm of subchondral bone. After verifying its position in the femoral neck and head in both AP and lateral projections, the guidewire was measured and appropriate sized lag screw was selected.  The channel for the compression screw was drilled and antirotation bar was placed.  Lag screw was  drilled and placed in appropriate position.  Compression screw was then placed.  Appropriate  compression was achieved.  The set screw was locked in place. Again, the adequacy of hardware position and fracture reduction was verified fluoroscopically in AP and lateral projections.   Attention was directed distally. Using the "perfect circle" technique, the leg and fluoroscopy machine were positioned appropriately. A 2cm stab incision was made over the skin and IT band at the appropriate point before the drill bit was advanced through the cortex and across the static hole of the nail. Appropriate screw length was determined with a measuring guide. A distal interlocking screw was placed. Again, the adequacy of screw position was verified fluoroscopically in AP and lateral projections.   The wounds were irrigated thoroughly with sterile saline solution. Local anesthetic was injected into the wounds. Deep fascia, including the IT band and the gluteus fascia, was closed with 0-Vicryl. The subcutaneous tissues were closed using 2-0 Vicryl interrupted sutures. The skin was closed using staples. Sterile occlusive dressings were applied to all wounds. The patient was then transferred to the recovery room in satisfactory condition.   This case had additional complexity compared to standard intertrochanteric fracture and cephalomedullary nailing due to the fracture pattern.  Appropriate reduction cannot be achieved using standard traction and rotation.  A larger lateral femoral incision with more dissection had to be made to allow for bone hook placement for more appropriate reduction.  Additionally, this necessitated IT band closure.  This surgery took ~30 minutes longer than usual cephalomedullary nailing.  POSTOPERATIVE PLAN: The patient will be WBAT on the operative extremity. Lovenox 40mg /day x 4 weeks to start on POD#1. Perioperative IV antibiotics x 24 hours. PT/OT on POD#1.

## 2020-06-13 NOTE — Progress Notes (Addendum)
Bladder scanned for 335cc urine, Unable to I and O cath with regular and coude catheters, resistance met. Pt attempted to void on his own and is unable to, Dr Posey Pronto paged and states to continue to allow pt to try.Marland Kitchen

## 2020-06-14 ENCOUNTER — Inpatient Hospital Stay: Payer: Self-pay

## 2020-06-14 LAB — URINALYSIS, ROUTINE W REFLEX MICROSCOPIC
Bacteria, UA: NONE SEEN
Bilirubin Urine: NEGATIVE
Glucose, UA: NEGATIVE mg/dL
Ketones, ur: NEGATIVE mg/dL
Nitrite: NEGATIVE
Protein, ur: 30 mg/dL — AB
Specific Gravity, Urine: 1.028 (ref 1.005–1.030)
WBC, UA: >50 WBC/hpf — ABNORMAL HIGH (ref 0–5)
pH: 5 (ref 5.0–8.0)

## 2020-06-14 LAB — CBC
HCT: 26 % — ABNORMAL LOW (ref 39.0–52.0)
Hemoglobin: 8.7 g/dL — ABNORMAL LOW (ref 13.0–17.0)
MCH: 31.9 pg (ref 26.0–34.0)
MCHC: 33.5 g/dL (ref 30.0–36.0)
MCV: 95.2 fL (ref 80.0–100.0)
Platelets: 154 10*3/uL (ref 150–400)
RBC: 2.73 MIL/uL — ABNORMAL LOW (ref 4.22–5.81)
RDW: 12.3 % (ref 11.5–15.5)
WBC: 7.7 10*3/uL (ref 4.0–10.5)
nRBC: 0 % (ref 0.0–0.2)

## 2020-06-14 LAB — BASIC METABOLIC PANEL
Anion gap: 8 (ref 5–15)
BUN: 16 mg/dL (ref 6–20)
CO2: 24 mmol/L (ref 22–32)
Calcium: 8 mg/dL — ABNORMAL LOW (ref 8.9–10.3)
Chloride: 103 mmol/L (ref 98–111)
Creatinine, Ser: 0.95 mg/dL (ref 0.61–1.24)
GFR, Estimated: >60 mL/min (ref >60)
Glucose, Bld: 119 mg/dL — ABNORMAL HIGH (ref 70–99)
Potassium: 3.7 mmol/L (ref 3.5–5.1)
Sodium: 135 mmol/L (ref 135–145)

## 2020-06-14 MED ORDER — METHOCARBAMOL 500 MG PO TABS: 500.0000 mg | ORAL_TABLET | Freq: Four times a day (QID) | ORAL | 0 refills | Status: AC | PRN

## 2020-06-14 MED ORDER — OXYCODONE HCL 5 MG PO TABS: 5.0000 mg | ORAL_TABLET | ORAL | 0 refills | Status: AC | PRN

## 2020-06-14 MED ORDER — ONDANSETRON HCL 4 MG PO TABS: 4.0000 mg | ORAL_TABLET | Freq: Four times a day (QID) | ORAL | 0 refills | Status: AC | PRN

## 2020-06-14 MED ORDER — TRAMADOL HCL 50 MG PO TABS: 50.0000 mg | ORAL_TABLET | Freq: Four times a day (QID) | ORAL | 0 refills | Status: AC | PRN

## 2020-06-14 MED ORDER — ENOXAPARIN SODIUM 40 MG/0.4ML ~~LOC~~ SOLN: 40.0000 mg | SUBCUTANEOUS | 0 refills | Status: AC

## 2020-06-14 NOTE — Progress Notes (Addendum)
  Subjective: 1 Day Post-Op Procedure(s) (LRB): INTRAMEDULLARY (IM) NAIL INTERTROCHANTRIC (Right) Patient reports pain as mild.   Patient is well, and has had no acute complaints or problems Plan is to go Home after hospital stay. Negative for chest pain and shortness of breath Fever: no Gastrointestinal: Negative for nausea and vomiting  Objective: Vital signs in last 24 hours: Temp:  [97.7 F (36.5 C)-98.9 F (37.2 C)] 98.1 F (36.7 C) (01/22 0530) Pulse Rate:  [72-107] 82 (01/22 0530) Resp:  [11-23] 18 (01/22 0530) BP: (103-127)/(61-85) 111/70 (01/22 0530) SpO2:  [93 %-100 %] 98 % (01/22 0530) Weight:  [104.3 kg] 104.3 kg (01/21 1241)  Intake/Output from previous day:  Intake/Output Summary (Last 24 hours) at 06/14/2020 0755 Last data filed at 06/14/2020 0304 Gross per 24 hour  Intake 1151.11 ml  Output 650 ml  Net 501.11 ml    Intake/Output this shift: No intake/output data recorded.  Labs: Recent Labs    06/12/20 1523 06/14/20 0550  HGB 13.5 8.7*   Recent Labs    06/12/20 1523 06/14/20 0550  WBC 13.7* 7.7  RBC 4.32 2.73*  HCT 40.4 26.0*  PLT 279 154   Recent Labs    06/12/20 1523 06/14/20 0550  NA 138 135  K 3.6 3.7  CL 103 103  CO2 22 24  BUN 19 16  CREATININE 0.88 0.95  GLUCOSE 134* 119*  CALCIUM 9.3 8.0*   Recent Labs    06/12/20 1523  INR 1.1     EXAM General - Patient is Alert and Oriented Extremity - Neurovascular intact Sensation intact distally Dorsiflexion/Plantar flexion intact Compartment soft Dressing/Incision - clean, scant drainage proximal incision Motor Function - intact, moving foot and toes well on exam.   Past Medical History:  Diagnosis Date  . GERD (gastroesophageal reflux disease)   . PONV (postoperative nausea and vomiting)     Assessment/Plan: 1 Day Post-Op Procedure(s) (LRB): INTRAMEDULLARY (IM) NAIL INTERTROCHANTRIC (Right) Active Problems:   Intertrochanteric fracture of right hip (HCC)  Estimated  body mass index is 30.34 kg/m as calculated from the following:   Height as of this encounter: 6\' 1"  (1.854 m).   Weight as of this encounter: 104.3 kg. Advance diet Up with therapy D/C IV fluids Plan for discharge tomorrow   Postoperative acute blood loss anemia.  Hemoglobin 8.7.  Recheck labs tomorrow.  Follow-up at [facility] clinic orthopedics in 2 weeks for staple removal and x-rays of the right hip  DVT Prophylaxis - Lovenox, Foot Pumps and TED hose Weight-Bearing as tolerated to right leg  [facility], PA-C Orthopaedic Surgery 06/14/2020, 7:55 AM

## 2020-06-14 NOTE — Evaluation (Signed)
Physical Therapy Evaluation Patient Details Name: Phillip Washington MRN: [id number] DOB: 1960/08/17 Today's Date: 06/14/2020   History of Present Illness  Pt is a 60yo M admitted to [facility] on 06/12/20 after slipping on ice while taking the garbage out, which resulted in comminuted R intertrochanteric fx. Pt with underlying DJD and was scheduled to see ortho surgeon at [facility] on Monday to discuss hip replacement sx. Pt without significant PMH. Pt s/p IM nailing of R femur with cephalomedullary device on 1/21 by Dr. Allena Katz and is WBAT on RLE.    Clinical Impression  Pt is a 60 year old M admitted to hospital on 06/12/20 for R hip fx; pt s/p R IM nailing by Dr. Allena Katz on 06/13/20 and is WBAT on RLE. At baseline, pt was Ind with all ADL's, IADL's, community ambulation without AD, driving, and worked full time as Corporate investment banker. Pt presents with RLE weakness, decreased RLE AROM, increased pain, decreased balance, and decreased activity tolerance secondary to acuity of sx, resulting in impaired functional mobility from baseline. Due to deficits, pt required min guard for bed mobility, transfers, and short distance ambulation with RW. After visual demonstration of gait training with RW, pt demonstrated antalgic gait including step to gait pattern, slowed cadence, decreased RLE WB, and decreased step length/foot clearance bil; gait deviations increase the pt's risk of falls. Pt able to participate in BLE exercises, requiring min guard - min assist for facilitation of RLE; further participation limited secondary to increased pain levels post session at 8/10. Deficits limit the pt's ability to safely and independently perform ADL's, transfer, and ambulate. Pt will benefit from acute skilled PT services to address deficits for return to baseline function. At this time, PT recommends DC home with HHPT and supervision for mobility/OOB; pt agreeable. Pt will benefit from RW and 3in1 for energy conservation and to improve overall  safety with functional mobility at home. Per significant other, Pattricia Boss, family will try to obtain DME independently unless insurance is able to cover costs.     Follow Up Recommendations Home health PT;Supervision for mobility/OOB    Equipment Recommendations  Rolling walker with 5" wheels;3in1 (PT)    Recommendations for Other Services       Precautions / Restrictions Precautions Precautions: Fall Restrictions Weight Bearing Restrictions: Yes RLE Weight Bearing: Weight bearing as tolerated      Mobility  Bed Mobility Overal bed mobility: Needs Assistance Bed Mobility: Supine to Sit     Supine to sit: Min guard;HOB elevated     General bed mobility comments: Min guard for RLE facilitation due to pain; increased time/effort with use of BUE for support    Transfers Overall transfer level: Needs assistance Equipment used: Rolling walker (2 wheeled) Transfers: Sit to/from Stand Sit to Stand: Min guard;From elevated surface         General transfer comment: Min guard to perform STS from elevated bed and recliner; verbal cues for RLE positioning, hand placement, and sequencing. Increased time/effort for stand-sit due to low seat height  Ambulation/Gait Ambulation/Gait assistance: Min guard Gait Distance (Feet): 2 Feet (3 forward, 3 backward steps) Assistive device: Rolling walker (2 wheeled)   Gait velocity: decreased   General Gait Details: After visual demonstration of gait training with RW, pt required min guard for safety for short distance ambulation from EOB>recliner with RW. Pt demonstrated step to gait pattern with decreased RLE WB, increased UE support with R stance, decreased step length/foot clearance bil, and slowed cadence. Verbal cues for sequencing/safety.  Balance Overall balance assessment: Needs assistance Sitting-balance support: Feet supported;No upper extremity supported Sitting balance-Leahy Scale: Good Sitting balance - Comments: Good seated  balance without LOB   Standing balance support: During functional activity;Bilateral upper extremity supported Standing balance-Leahy Scale: Fair Standing balance comment: Fair standing balance in RW due to increased pain                             Pertinent Vitals/Pain Pain Assessment: 0-10 Pain Score: 5  Pain Location: 5-6/10 general discomfort at beginning of session, progressing to 8-9/10 at end of session Pain Intervention(s): Monitored during session;RN gave pain meds during session;Repositioned    Home Living Family/patient expects to be discharged to:: Private residence Living Arrangements: Spouse/significant other Available Help at Discharge: Family;Available 24 hours/day;Available PRN/intermittently (24/7 supervision, PRN assistance) Type of Home: House Home Access: Stairs to enter   [employer] of Steps: side entrance with 3-4 STE without railing, back deck entrance with multiple STE and bil railing, front entrance with 2 STE and no railing Home Layout: One level Home Equipment: Crutches Additional Comments: walker (doesn't know what kind)    Prior Function Level of Independence: Independent         Comments: Pt reports being Ind with all ADL's, IADL's, community ambulation without AD, driving, and 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     Hand Dominance        Extremity/Trunk Assessment   Upper Extremity Assessment Upper Extremity Assessment: Defer to OT evaluation    Lower Extremity Assessment Lower Extremity Assessment: Overall WFL for tasks assessed;RLE deficits/detail RLE Deficits / Details: unable to formally assess due to acuity of sx; observed hip flex at 2+/5, knee flexion 2+/5, knee ext 3-/5, and DF/PF 3+/5 RLE Sensation: WNL RLE Coordination: WNL    Cervical / Trunk Assessment Cervical / Trunk Assessment: Normal  Communication   Communication: No difficulties  Cognition Arousal/Alertness: Awake/alert Behavior  During Therapy: WFL for tasks assessed/performed Overall Cognitive Status: Within Functional Limits for tasks assessed                                 General Comments: A&O x4 and able to follow 100% of multistep commands      General Comments General comments (skin integrity, edema, etc.): Increase in pain levels with mobility    Exercises Total Joint Exercises Ankle Circles/Pumps: AROM;Strengthening;Both;10 reps Quad Sets: AROM;Strengthening;Both;10 reps Gluteal Sets: AROM;Strengthening;Both;10 reps Heel Slides: Strengthening;AROM;Left;AAROM;Right;10 reps Long Arc Quad: Strengthening;AROM;Left;10 reps;AAROM;Right Other Exercises Other Exercises: Pt able to participate in bed mobility, transfers, and short distance ambulation with RW, requiring grossly min guard for safety. Pt required increased time/effort for all mobility due to increased pain levels. Antalgic gait demonstrated secondary to acuity of sx, required increased cues for sequencing/safety. Other Exercises: Pt educated regarding: PT role/POC, DC rec, RLE WB precautions, HEP, pain management techniques, gait training with RW, and safety with mobility. Per pt request, significant other educated regarding: PT role/POC, DC rec, and pt deficits/participation. They both verbalized understanding of all education.   Assessment/Plan    PT Assessment Patient needs continued PT services  PT Problem List Decreased strength;Decreased mobility;Decreased range of motion;Decreased activity tolerance;Decreased balance;Pain       PT Treatment Interventions Gait training;Stair training;Functional mobility training;Therapeutic activities;Therapeutic exercise;Balance training;Neuromuscular re-education    PT Goals (Current goals can be found in the Care Plan section)  Acute Rehab PT  Goals Patient Stated Goal: "to get better" PT Goal Formulation: With patient Time For Goal Achievement: 06/28/20 Potential to Achieve Goals:  Good    Frequency BID    AM-PAC PT "6 Clicks" Mobility  Outcome Measure Help needed turning from your back to your side while in a flat bed without using bedrails?: A Little Help needed moving from lying on your back to sitting on the side of a flat bed without using bedrails?: A Little Help needed moving to and from a bed to a chair (including a wheelchair)?: A Little Help needed standing up from a chair using your arms (e.g., wheelchair or bedside chair)?: A Little Help needed to walk in hospital room?: A Little Help needed climbing 3-5 steps with a railing? : A Lot 6 Click Score: 17    End of Session Equipment Utilized During Treatment: Gait belt Activity Tolerance: Patient tolerated treatment well;Patient limited by pain Patient left: in chair;with call bell/phone within reach;with nursing/sitter in room Nurse Communication: Mobility status PT Visit Diagnosis: Unsteadiness on feet (R26.81);Muscle weakness (generalized) (M62.81);Pain Pain - Right/Left: Right Pain - part of body: Hip    Time: 1027-2536 PT Time Calculation (min) (ACUTE ONLY): 35 min   Charges:   PT Evaluation $PT Eval Low Complexity: 1 Low PT Treatments $Gait Training: 8-22 mins $Therapeutic Exercise: 8-22 mins        Vira Blanco, PT, DPT 10:05 AM,06/14/20

## 2020-06-14 NOTE — Evaluation (Signed)
Occupational Therapy Evaluation Patient Details Name: Phillip Washington MRN: [id number] DOB: April 24, 1961 Today's Date: 06/14/2020    History of Present Illness Pt is a 59yo M admitted to [facility] on 06/12/20 after slipping on ice while taking the garbage out, which resulted in comminuted R intertrochanteric fx. Pt with underlying DJD and was scheduled to see ortho surgeon at [facility] on Monday to discuss hip replacement sx. Pt without significant PMH. Pt s/p IM nailing of R femur with cephalomedullary device on 1/21 by Dr. Allena Katz and is WBAT on RLE.   Clinical Impression   Phillip Washington was seen for OT evaluation this date. Prior to hospital admission, pt was Independent in mobility and I/ADLs including working full time. Pt lives c significant other in home c 2 STE. Pt presents to acute OT demonstrating impaired ADL performance and functional mobility 2/2 poor activity tolerance and functional strength/ROM/balance deficits. Upon arrival pt reclined in bed reporting 8/10 pain - RN in room to administer pain meds at start of session. Pt currently requires MAX A don B socks seated EOB. CGA + RW for simulated toilet t/f ~ 20 ft. SETUP don/doff gown seated EOB. Pt would benefit from skilled OT to address noted impairments and functional limitations (see below for any additional details) in order to maximize safety and independence while minimizing falls risk and caregiver burden. Upon hospital discharge, recommend HHOT to maximize pt safety and return to functional independence during meaningful occupations of daily life.     Follow Up Recommendations  Home health OT;Supervision/Assistance - 24 hour    Equipment Recommendations  3 in 1 bedside commode    Recommendations for Other Services       Precautions / Restrictions Precautions Precautions: Fall Restrictions Weight Bearing Restrictions: Yes RLE Weight Bearing: Weight bearing as tolerated      Mobility Bed Mobility Overal bed mobility: Needs  Assistance Bed Mobility: Sit to Supine     Supine to sit: Min guard;HOB elevated Sit to supine: Min assist   General bed mobility comments: Assist 2/2 pain    Transfers Overall transfer level: Needs assistance Equipment used: Rolling walker (2 wheeled) Transfers: Sit to/from Stand Sit to Stand: Min guard;From elevated surface         General transfer comment: CGA + RW    Balance Overall balance assessment: Needs assistance Sitting-balance support: Feet supported;No upper extremity supported Sitting balance-Leahy Scale: Good Sitting balance - Comments: Good seated balance without LOB   Standing balance support: During functional activity;Bilateral upper extremity supported Standing balance-Leahy Scale: Fair Standing balance comment: Fair standing balance in RW due to increased pain                           ADL either performed or assessed with clinical judgement   ADL Overall ADL's : Needs assistance/impaired                                       General ADL Comments: MAX A don B socks seated EOB. CGA + RW for simulated toilet t/f. SETUP don/doff gown seated EOB                  Pertinent Vitals/Pain Pain Assessment: 0-10 Pain Score: 8  Pain Location: R hip Pain Descriptors / Indicators: Sore;Dull;Discomfort Pain Intervention(s): Limited activity within patient's tolerance;Repositioned;Patient requesting pain meds-RN notified;RN gave pain meds during session  Hand Dominance     Extremity/Trunk Assessment Upper Extremity Assessment Upper Extremity Assessment: Overall WFL for tasks assessed   Lower Extremity Assessment Lower Extremity Assessment: RLE deficits/detail RLE Deficits / Details: reports stiff from sitting in chair RLE: Unable to fully assess due to pain RLE Sensation: WNL RLE Coordination: WNL   Cervical / Trunk Assessment Cervical / Trunk Assessment: Normal   Communication Communication Communication: No  difficulties   Cognition Arousal/Alertness: Awake/alert Behavior During Therapy: WFL for tasks assessed/performed Overall Cognitive Status: Within Functional Limits for tasks assessed                                 General Comments: A&O x4 and able to follow 100% of multistep commands   General Comments  Increase in pain levels with mobility    Exercises Exercises: Other exercises Other Exercises Other Exercises: Pt educated re: OT role, DME recs, d/c recs, falls prevention, ECS, home/routines modifications, adapted dressing technques Other Exercises: LBD, UBD, sup<>sit, sit<>stand, ~20 ft mobility, sitting/standing balance/tolerance   Shoulder Instructions      Home Living Family/patient expects to be discharged to:: Private residence Living Arrangements: Spouse/significant other Available Help at Discharge: Family;Available 24 hours/day;Available PRN/intermittently Type of Home: House Home Access: Stairs to enter [employer] of Steps: side entrance with 3-4 STE without railing, back deck entrance with multiple STE and bil railing, front entrance with 2 STE and no railing   Home Layout: One level     Bathroom Shower/Tub: Producer, television/film/video: Standard     Home Equipment: Crutches   Additional Comments: walker (doesn't know what kind)      Prior Functioning/Environment Level of Independence: Independent        Comments: Pt reports being Ind with all ADL's, IADL's, community ambulation without AD, driving, and 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        OT Problem List: Decreased strength;Decreased activity tolerance;Decreased range of motion;Impaired balance (sitting and/or standing)      OT Treatment/Interventions: Self-care/ADL training;Therapeutic exercise;DME and/or AE instruction;Therapeutic activities;Patient/family education;Balance training    OT Goals(Current goals can be found in the care plan section) Acute  Rehab OT Goals Patient Stated Goal: "to get better" OT Goal Formulation: With patient Time For Goal Achievement: 06/28/20 Potential to Achieve Goals: Good ADL Goals Pt Will Perform Grooming: with modified independence;standing (c LRAD PRN) Pt Will Perform Lower Body Dressing: sit to/from stand;with min assist (c LRAD PRN) Pt Will Transfer to Toilet: with modified independence;ambulating;regular height toilet (c LRAD PRN)  OT Frequency: Min 2X/week    AM-PAC OT "6 Clicks" Daily Activity     Outcome Measure Help from another person eating meals?: None Help from another person taking care of personal grooming?: None Help from another person toileting, which includes using toliet, bedpan, or urinal?: A Little Help from another person bathing (including washing, rinsing, drying)?: A Little Help from another person to put on and taking off regular upper body clothing?: None Help from another person to put on and taking off regular lower body clothing?: A Little 6 Click Score: 21   End of Session Equipment Utilized During Treatment: Rolling walker Nurse Communication: Mobility status  Activity Tolerance: Patient tolerated treatment well;Patient limited by pain Patient left: in bed;with call bell/phone within reach  OT Visit Diagnosis: Other abnormalities of gait and mobility (R26.89);Muscle weakness (generalized) (M62.81)  Time: 2542-7062 OT Time Calculation (min): 15 min Charges:  OT General Charges $OT Visit: 1 Visit OT Evaluation $OT Eval Low Complexity: 1 Low OT Treatments $Self Care/Home Management : 8-22 mins  Kathie Dike, M.S. OTR/L  06/14/20, 12:59 PM  ascom [phone]

## 2020-06-14 NOTE — Progress Notes (Signed)
   06/14/20 2005  Assess: MEWS Score  Temp (!) 103.2 F (39.6 C)  BP 118/68  Pulse Rate (!) 112  Resp 20  SpO2 93 %  O2 Device Room Air  Assess: MEWS Score  MEWS Temp 2  MEWS Systolic 0  MEWS Pulse 2  MEWS RR 0  MEWS LOC 0  MEWS Score 4  MEWS Score Color Red  Assess: if the MEWS score is Yellow or Red  Were vital signs taken at a resting state? Yes  Focused Assessment No change from prior assessment  Early Detection of Sepsis Score *See Row Information* High  MEWS guidelines implemented *See Row Information* No, vital signs rechecked  Notify: Charge Nurse/RN  Name of Charge Nurse/RN Notified Audria Nine RN, Jillian RN  Date Charge Nurse/RN Notified 06/14/20  Time Charge Nurse/RN Notified 2005  Notify: Provider  Provider Name/Title Allena Katz, sunny  Date Provider Notified 06/14/20  Time Provider Notified 2005  Notification Type Page  Notification Reason Other (Comment) (increased temp)  Response See new orders  Date of Provider Response 06/14/20  Time of Provider Response 2005

## 2020-06-14 NOTE — Discharge Instructions (Signed)

## 2020-06-14 NOTE — Progress Notes (Signed)
Physical Therapy Treatment Patient Details Name: Phillip Washington MRN: [id number] DOB: 09-04-1960 Today's Date: 06/14/2020    History of Present Illness Pt is a 59yo M admitted to [facility] on 06/12/20 after slipping on ice while taking the garbage out, which resulted in comminuted R intertrochanteric fx. Pt with underlying DJD and was scheduled to see ortho surgeon at [facility] on Monday to discuss hip replacement sx. Pt without significant PMH. Pt s/p IM nailing of R femur with cephalomedullary device on 1/21 by Dr. Allena Katz and is WBAT on RLE.    PT Comments    Pt declining OOB mobility this PM as he had just gotten into bed prior to PT arrival, but was agreeable for supine therex. Pt able to demonstrate good understanding and execution of supine therex packet. Pt continues to require min-mod assist for RLE heel slides, hip ABD/ADD, and SLR due to increased pain/weakness. Increased pain levels reported post session. Pt will continue to benefit from skilled acute PT services to address deficits for return to baseline function. Will continue to recommend DC home with HHPT, supervision for OOB/mobilit, RW, and 3in1.    Follow Up Recommendations  Home health PT;Supervision for mobility/OOB     Equipment Recommendations  Rolling walker with 5" wheels;3in1 (PT)    Recommendations for Other Services       Precautions / Restrictions Precautions Precautions: Fall Restrictions Weight Bearing Restrictions: Yes RLE Weight Bearing: Weight bearing as tolerated    Mobility  Bed Mobility Overal bed mobility: Needs Assistance Bed Mobility: Sit to Supine       Sit to supine: Min assist   General bed mobility comments: Declining participation with OOB mobility, as he just got back into bed  Transfers Overall transfer level: Needs assistance Equipment used: Rolling walker (2 wheeled) Transfers: Sit to/from Stand Sit to Stand: Min guard;From elevated surface         General transfer comment: CGA +  RW  Ambulation/Gait                   Balance Overall balance assessment: Needs assistance Sitting-balance support: Feet supported;No upper extremity supported Sitting balance-Leahy Scale: Good Sitting balance - Comments: Declining participation with OOB mobility, as he just got back into bed   Standing balance support: During functional activity;Bilateral upper extremity supported Standing balance-Leahy Scale: Fair Standing balance comment: Fair standing balance in RW due to increased pain                            Cognition Arousal/Alertness: Awake/alert Behavior During Therapy: WFL for tasks assessed/performed Overall Cognitive Status: Within Functional Limits for tasks assessed                                 General Comments: Pt able to follow 100% of multistep commands      Exercises Total Joint Exercises Ankle Circles/Pumps: AROM;Strengthening;Both;10 reps Quad Sets: AROM;Strengthening;Both;10 reps Gluteal Sets: AROM;Strengthening;Both;10 reps Short Arc Quad: AROM;Strengthening;Both;10 reps Heel Slides: Strengthening;AROM;Left;AAROM;Right;10 reps Hip ABduction/ADduction: AROM;Strengthening;Both;10 reps (isometric hip ADD with pillow squeeze, x10 bil with 3sec hold) Straight Leg Raises: AROM;Strengthening;Left;AAROM;Right;10 reps Other Exercises Other Exercises: Pt declining OOB mobility as he just got back in bed prior to PT arrival. Pt able to demonstrate Ind with supine scooting towards [facility] for repositioning. Other Exercises: Pt able to participate in x10 BLE exercises including: ankle pumps, heel slides, SAQ, SLR, quad sets,  glute sets, hip ABD/ADD, and isometric hip ADD. Pt was educated/given supine and seated therex handout; he verbalized understanding. Other Exercises: Pt educated regarding pain management techniques (PLB), performance of HEP, and safety with mobility; he verbalized understanding.    General Comments         Pertinent Vitals/Pain Pain Assessment: 0-10 Pain Score: 4  Pain Location: R hip, 4/10 at beginning of session progressing to 6/10 post session Pain Descriptors / Indicators: Sore;Dull;Discomfort Pain Intervention(s): Limited activity within patient's tolerance;Monitored during session;Repositioned    Home Living Family/patient expects to be discharged to:: Private residence Living Arrangements: Spouse/significant other Available Help at Discharge: Family;Available 24 hours/day;Available PRN/intermittently Type of Home: House Home Access: Stairs to enter   Home Layout: One level Home Equipment: Crutches Additional Comments: walker (doesn't know what kind)    Prior Function Level of Independence: Independent      Comments: Pt reports being Ind with all ADL's, IADL's, community ambulation without AD, driving, and 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   PT Goals (current goals can now be found in the care plan section) Acute Rehab PT Goals Patient Stated Goal: "to get better" PT Goal Formulation: With patient Time For Goal Achievement: 06/28/20 Potential to Achieve Goals: Good Progress towards PT goals: Progressing toward goals    Frequency    BID      PT Plan Current plan remains appropriate       AM-PAC PT "6 Clicks" Mobility   Outcome Measure  Help needed turning from your back to your side while in a flat bed without using bedrails?: A Little Help needed moving from lying on your back to sitting on the side of a flat bed without using bedrails?: A Little Help needed moving to and from a bed to a chair (including a wheelchair)?: A Little Help needed standing up from a chair using your arms (e.g., wheelchair or bedside chair)?: A Little Help needed to walk in hospital room?: A Little Help needed climbing 3-5 steps with a railing? : A Lot 6 Click Score: 17    End of Session   Activity Tolerance: Patient tolerated treatment well;Patient limited by  pain Patient left: in bed;with call bell/phone within reach;with bed alarm set Nurse Communication: Mobility status PT Visit Diagnosis: Unsteadiness on feet (R26.81);Muscle weakness (generalized) (M62.81);Pain Pain - Right/Left: Right Pain - part of body: Hip     Time: 0712-1975 PT Time Calculation (min) (ACUTE ONLY): 25 min  Charges:  $Therapeutic Exercise: 23-37 mins                    Vira Blanco, PT, DPT 3:47 PM,06/14/20

## 2020-06-14 NOTE — Plan of Care (Signed)

## 2020-06-14 NOTE — Progress Notes (Signed)
   06/14/20 2115  Assess: MEWS Score  Temp 98.6 F (37 C)  Assess: MEWS Score  MEWS Temp 0  MEWS Systolic 0  MEWS Pulse 2  MEWS RR 0  MEWS LOC 0  MEWS Score 2  MEWS Score Color Yellow  Assess: if the MEWS score is Yellow or Red  Were vital signs taken at a resting state? Yes  Focused Assessment No change from prior assessment  Early Detection of Sepsis Score *See Row Information* High  MEWS guidelines implemented *See Row Information* No, vital signs rechecked  Document  Patient Outcome Stabilized after interventions  Progress note created (see row info) Yes

## 2020-06-15 LAB — CBC
HCT: 20.3 % — ABNORMAL LOW (ref 39.0–52.0)
Hemoglobin: 7.1 g/dL — ABNORMAL LOW (ref 13.0–17.0)
MCH: 32.4 pg (ref 26.0–34.0)
MCHC: 35 g/dL (ref 30.0–36.0)
MCV: 92.7 fL (ref 80.0–100.0)
Platelets: 127 10*3/uL — ABNORMAL LOW (ref 150–400)
RBC: 2.19 MIL/uL — ABNORMAL LOW (ref 4.22–5.81)
RDW: 12.2 % (ref 11.5–15.5)
WBC: 4.7 10*3/uL (ref 4.0–10.5)
nRBC: 0 % (ref 0.0–0.2)

## 2020-06-15 LAB — HEMOGLOBIN AND HEMATOCRIT, BLOOD
HCT: 23.7 % — ABNORMAL LOW (ref 39.0–52.0)
Hemoglobin: 8.1 g/dL — ABNORMAL LOW (ref 13.0–17.0)

## 2020-06-15 LAB — BASIC METABOLIC PANEL
Anion gap: 7 (ref 5–15)
BUN: 13 mg/dL (ref 6–20)
CO2: 24 mmol/L (ref 22–32)
Calcium: 7.8 mg/dL — ABNORMAL LOW (ref 8.9–10.3)
Chloride: 103 mmol/L (ref 98–111)
Creatinine, Ser: 0.86 mg/dL (ref 0.61–1.24)
GFR, Estimated: >60 mL/min (ref >60)
Glucose, Bld: 115 mg/dL — ABNORMAL HIGH (ref 70–99)
Potassium: 3.6 mmol/L (ref 3.5–5.1)
Sodium: 134 mmol/L — ABNORMAL LOW (ref 135–145)

## 2020-06-15 LAB — SARS CORONAVIRUS 2 (TAT 6-24 HRS): SARS Coronavirus 2: NEGATIVE

## 2020-06-15 LAB — ABO/RH: ABO/RH(D): O POS

## 2020-06-15 LAB — PREPARE RBC (CROSSMATCH)

## 2020-06-15 MED ORDER — SODIUM CHLORIDE 0.9% IV SOLUTION: Freq: Once | INTRAVENOUS | Status: DC

## 2020-06-15 NOTE — Progress Notes (Signed)
Physical Therapy Treatment Patient Details Name: Phillip Washington MRN: [id number] DOB: Aug 21, 1960 Today's Date: 06/15/2020    History of Present Illness Pt is a 60yo M admitted to [facility] on 06/12/20 after slipping on ice while taking the garbage out, which resulted in comminuted R intertrochanteric fx. Pt with underlying DJD and was scheduled to see ortho surgeon at [facility] on Monday to discuss hip replacement sx. Pt without significant PMH. Pt s/p IM nailing of R femur with cephalomedullary device on 1/21 by Dr. Posey Pronto and is WBAT on RLE.    PT Comments    Pt has met goals for home discharge. Stair training complete with safe technique. Good endurance with ambulation, although does fatigue. IMproved step to gait pattern to reciprocal gait, still slow speed. Will continue to progress as necessary.  Follow Up Recommendations  Home health PT     Equipment Recommendations   (reports he got equipment delivered today)    Recommendations for Other Services       Precautions / Restrictions Precautions Precautions: Fall Restrictions Weight Bearing Restrictions: Yes RLE Weight Bearing: Weight bearing as tolerated    Mobility  Bed Mobility Overal bed mobility: Needs Assistance Bed Mobility: Supine to Sit     Supine to sit: Min guard;HOB elevated Sit to supine: Min assist   General bed mobility comments: safe technique, slight assist with R LE  Transfers Overall transfer level: Needs assistance Equipment used: Rolling walker (2 wheeled) Transfers: Sit to/from Stand Sit to Stand: Min guard         General transfer comment: safe technique  Ambulation/Gait Ambulation/Gait assistance: Min guard Gait Distance (Feet): 160 Feet Assistive device: Rolling walker (2 wheeled) Gait Pattern/deviations: Step-through pattern     General Gait Details: cues for reciprocal and fluid gait pattern. Safe technique. Does fatigue with increased distance. Slow speed   Stairs Stairs: Yes Stairs  assistance: Min guard Stair Management: No rails;Step to pattern;Backwards Number of Stairs: 2 General stair comments: Performed stair training with safe technique. Demonstration provided prior to performance.   Wheelchair Mobility    Modified Rankin (Stroke Patients Only)       Balance Overall balance assessment: Needs assistance Sitting-balance support: Feet supported;No upper extremity supported Sitting balance-Leahy Scale: Good     Standing balance support: Bilateral upper extremity supported Standing balance-Leahy Scale: Good                              Cognition Arousal/Alertness: Awake/alert Behavior During Therapy: WFL for tasks assessed/performed Overall Cognitive Status: Within Functional Limits for tasks assessed                                        Exercises Other Exercises Other Exercises: ambulated to bathroom, able to urinate with supervision. Safe technique    General Comments        Pertinent Vitals/Pain Pain Assessment: 0-10 Pain Score: 8  Pain Location: R hip Pain Descriptors / Indicators: Operative site guarding Pain Intervention(s): Limited activity within patient's tolerance;Patient requesting pain meds-RN notified    Home Living                      Prior Function            PT Goals (current goals can now be found in the care plan section) Acute Rehab PT Goals Patient  Stated Goal: "to get better" PT Goal Formulation: With patient Time For Goal Achievement: 06/28/20 Potential to Achieve Goals: Good Progress towards PT goals: Progressing toward goals    Frequency    BID      PT Plan Current plan remains appropriate    Co-evaluation              AM-PAC PT "6 Clicks" Mobility   Outcome Measure  Help needed turning from your back to your side while in a flat bed without using bedrails?: A Little Help needed moving from lying on your back to sitting on the side of a flat bed  without using bedrails?: A Little Help needed moving to and from a bed to a chair (including a wheelchair)?: A Little Help needed standing up from a chair using your arms (e.g., wheelchair or bedside chair)?: A Little Help needed to walk in hospital room?: A Little Help needed climbing 3-5 steps with a railing? : A Little 6 Click Score: 18    End of Session Equipment Utilized During Treatment: Gait belt Activity Tolerance: Patient limited by pain Patient left: in bed Nurse Communication: Mobility status PT Visit Diagnosis: Unsteadiness on feet (R26.81);Muscle weakness (generalized) (M62.81);Pain Pain - Right/Left: Right Pain - part of body: Hip     Time: 2780-0447 PT Time Calculation (min) (ACUTE ONLY): 25 min  Charges:  $Gait Training: 8-22 mins $Therapeutic Activity: 8-22 mins                     Greggory Stallion, PT, DPT [phone]    Tyyne Cliett 06/15/2020, 3:55 PM

## 2020-06-15 NOTE — Progress Notes (Signed)
  Subjective: 2 Days Post-Op Procedure(s) (LRB): INTRAMEDULLARY (IM) NAIL INTERTROCHANTRIC (Right) Patient reports pain as mild.   Patient is well, and has had no acute complaints or problems Plan is to go Home after hospital stay. Negative for chest pain and shortness of breath Fever: no Gastrointestinal: Negative for nausea and vomiting  Objective: Vital signs in last 24 hours: Temp:  [98.5 F (36.9 C)-103.2 F (39.6 C)] 98.8 F (37.1 C) (01/23 0400) Pulse Rate:  [98-112] 105 (01/23 0400) Resp:  [16-20] 20 (01/23 0400) BP: (98-118)/(58-68) 113/64 (01/23 0400) SpO2:  [93 %-96 %] 94 % (01/23 0400)  Intake/Output from previous day:  Intake/Output Summary (Last 24 hours) at 06/15/2020 0813 Last data filed at 06/15/2020 0400 Gross per 24 hour  Intake 220 ml  Output 825 ml  Net -605 ml    Intake/Output this shift: No intake/output data recorded.  Labs: Recent Labs    06/12/20 1523 06/14/20 0550 06/15/20 0541  HGB 13.5 8.7* 7.1*   Recent Labs    06/14/20 0550 06/15/20 0541  WBC 7.7 4.7  RBC 2.73* 2.19*  HCT 26.0* 20.3*  PLT 154 127*   Recent Labs    06/14/20 0550 06/15/20 0541  NA 135 134*  K 3.7 3.6  CL 103 103  CO2 24 24  BUN 16 13  CREATININE 0.95 0.86  GLUCOSE 119* 115*  CALCIUM 8.0* 7.8*   Recent Labs    06/12/20 1523  INR 1.1     EXAM General - Patient is Alert and Oriented Extremity - Neurovascular intact Sensation intact distally Dorsiflexion/Plantar flexion intact Compartment soft Dressing/Incision - clean, scant drainage proximal incision Motor Function - intact, moving foot and toes well on exam.  Ambulating with physical therapy.  Past Medical History:  Diagnosis Date  . GERD (gastroesophageal reflux disease)   . PONV (postoperative nausea and vomiting)     Assessment/Plan: 2 Days Post-Op Procedure(s) (LRB): INTRAMEDULLARY (IM) NAIL INTERTROCHANTRIC (Right) Active Problems:   Intertrochanteric fracture of right hip  (HCC)  Estimated body mass index is 30.34 kg/m as calculated from the following:   Height as of this encounter: 6\' 1"  (1.854 m).   Weight as of this encounter: 104.3 kg. Advance diet Up with therapy D/C IV fluids Plan for discharge tomorrow   Postoperative acute blood loss anemia.  Hemoglobin 7.1.  The patient will receive 1 unit transfused blood.  Recheck labs tomorrow.  Fever last night with normal temperature today.  No urinary symptoms.  Follow-up at [facility] clinic orthopedics in 2 weeks for staple removal and x-rays of the right hip  DVT Prophylaxis - Lovenox, Foot Pumps and TED hose Weight-Bearing as tolerated to right leg  [facility], PA-C Orthopaedic Surgery 06/15/2020, 8:13 AM

## 2020-06-15 NOTE — Progress Notes (Signed)
PT Cancellation Note  Patient Details Name: Phillip Washington MRN: [id number] DOB: 02-18-1961   Cancelled Treatment:    Reason Eval/Treat Not Completed: Medical issues which prohibited therapy   Pt sleeping this am.  Awaiting blood transfusion.  Will reschedule for PM as time allows.  HgB 7.1   Danielle Dess 06/15/2020, 11:32 AM

## 2020-06-16 ENCOUNTER — Encounter: Payer: Self-pay | Admitting: Orthopedic Surgery

## 2020-06-16 LAB — TYPE AND SCREEN
ABO/RH(D): O POS
Antibody Screen: NEGATIVE
Unit division: 0

## 2020-06-16 LAB — BASIC METABOLIC PANEL
Anion gap: 8 (ref 5–15)
BUN: 12 mg/dL (ref 6–20)
CO2: 27 mmol/L (ref 22–32)
Calcium: 7.8 mg/dL — ABNORMAL LOW (ref 8.9–10.3)
Chloride: 103 mmol/L (ref 98–111)
Creatinine, Ser: 0.81 mg/dL (ref 0.61–1.24)
GFR, Estimated: >60 mL/min (ref >60)
Glucose, Bld: 117 mg/dL — ABNORMAL HIGH (ref 70–99)
Potassium: 3.7 mmol/L (ref 3.5–5.1)
Sodium: 138 mmol/L (ref 135–145)

## 2020-06-16 LAB — CBC
HCT: 22.8 % — ABNORMAL LOW (ref 39.0–52.0)
Hemoglobin: 7.9 g/dL — ABNORMAL LOW (ref 13.0–17.0)
MCH: 31.7 pg (ref 26.0–34.0)
MCHC: 34.6 g/dL (ref 30.0–36.0)
MCV: 91.6 fL (ref 80.0–100.0)
Platelets: 141 10*3/uL — ABNORMAL LOW (ref 150–400)
RBC: 2.49 MIL/uL — ABNORMAL LOW (ref 4.22–5.81)
RDW: 12.2 % (ref 11.5–15.5)
WBC: 5 10*3/uL (ref 4.0–10.5)
nRBC: 0 % (ref 0.0–0.2)

## 2020-06-16 LAB — BPAM RBC
Blood Product Expiration Date: 202202252359
ISSUE DATE / TIME: 202201231112
Unit Type and Rh: 5100

## 2020-06-16 NOTE — Progress Notes (Signed)
  Subjective: 3 Days Post-Op Procedure(s) (LRB): INTRAMEDULLARY (IM) NAIL INTERTROCHANTRIC (Right) Patient reports pain as mild.   Patient is well, and has had no acute complaints or problems  S/P transfusion yesteray of 1 unit of PRBC. Plan is to go Home after hospital stay. Negative for chest pain and shortness of breath Fever: no Gastrointestinal: Negative for nausea and vomiting  Objective: Vital signs in last 24 hours: Temp:  [97.7 F (36.5 C)-98.7 F (37.1 C)] 98 F (36.7 C) (01/24 0750) Pulse Rate:  [74-102] 102 (01/24 0750) Resp:  [17-20] 17 (01/24 0750) BP: (107-123)/(62-77) 117/76 (01/24 0750) SpO2:  [95 %-100 %] 98 % (01/24 0750)  Intake/Output from previous day:  Intake/Output Summary (Last 24 hours) at 06/16/2020 0801 Last data filed at 06/16/2020 0520 Gross per 24 hour  Intake 482 ml  Output 2000 ml  Net -1518 ml    Intake/Output this shift: No intake/output data recorded.  Labs: Recent Labs    06/14/20 0550 06/15/20 0541 06/15/20 1602 06/16/20 0321  HGB 8.7* 7.1* 8.1* 7.9*   Recent Labs    06/15/20 0541 06/15/20 1602 06/16/20 0321  WBC 4.7  --  5.0  RBC 2.19*  --  2.49*  HCT 20.3* 23.7* 22.8*  PLT 127*  --  141*   Recent Labs    06/15/20 0541 06/16/20 0321  NA 134* 138  K 3.6 3.7  CL 103 103  CO2 24 27  BUN 13 12  CREATININE 0.86 0.81  GLUCOSE 115* 117*  CALCIUM 7.8* 7.8*   No results for input(s): LABPT, INR in the last 72 hours.   EXAM General - Patient is Alert and Oriented Extremity - Neurovascular intact Sensation intact distally Dorsiflexion/Plantar flexion intact Incision: moderate drainage Compartment soft  Swelling noted to the right quad, negative homans to the right leg.  Compartments are soft to palpation. Dressing/Incision - Moderate drainage to the proximal incision, new dressing applied today. Motor Function - intact, moving foot and toes well on exam.  Ambulating with physical therapy.  Past Medical History:   Diagnosis Date  . GERD (gastroesophageal reflux disease)   . PONV (postoperative nausea and vomiting)     Assessment/Plan: 3 Days Post-Op Procedure(s) (LRB): INTRAMEDULLARY (IM) NAIL INTERTROCHANTRIC (Right) Active Problems:   Intertrochanteric fracture of right hip (HCC)  Estimated body mass index is 30.34 kg/m as calculated from the following:   Height as of this encounter: 6\' 1"  (1.854 m).   Weight as of this encounter: 104.3 kg. Advance diet Up with therapy D/C IV fluids   Labs reviewed this AM, Hg 7.9. WIll work with PT today, patient denies any dizziness this AM. If he performs well with PT today will plan for discharge today. Upon discharge, patient will continue Lovenox 40mg  daily for 14 days. Follow-up with [facility] in 10-14 days for x-rays of the right femur and staple removal. No recent fever, WBC 5.0.  DVT Prophylaxis - Lovenox, Foot Pumps and TED hose Weight-Bearing as tolerated to right leg  [facility], PA-C Orthopaedic Surgery 06/16/2020, 8:01 AM

## 2020-06-16 NOTE — Progress Notes (Signed)
Physical Therapy Treatment Patient Details Name: Phillip Washington MRN: [id number] DOB: 1960-10-05 Today's Date: 06/16/2020    History of Present Illness Pt is a 60yo M admitted to [facility] on 06/12/20 after slipping on ice while taking the garbage out, which resulted in comminuted R intertrochanteric fx. Pt with underlying DJD and was scheduled to see ortho surgeon at [facility] on Monday to discuss hip replacement sx. Pt without significant PMH. Pt s/p IM nailing of R femur with cephalomedullary device on 1/21 by Dr. Allena Katz and is WBAT on RLE.    PT Comments    To/from PT gym for stair review.  Steady with good safety with mobility.  No further questions or concerns for PT at this time.    Follow Up Recommendations  Home health PT     Equipment Recommendations  Rolling walker with 5" wheels    Recommendations for Other Services       Precautions / Restrictions Precautions Precautions: Fall Restrictions Weight Bearing Restrictions: Yes RLE Weight Bearing: Weight bearing as tolerated    Mobility  Bed Mobility Overal bed mobility: Modified Independent                Transfers Overall transfer level: Modified independent                  Ambulation/Gait Ambulation/Gait assistance: Supervision;Min guard Gait Distance (Feet): 160 Feet Assistive device: Rolling walker (2 wheeled) Gait Pattern/deviations: Step-through pattern Gait velocity: decreased       Stairs Stairs: Yes Stairs assistance: Min guard Stair Management: No rails;Step to pattern;Backwards;With walker Number of Stairs: 3     Wheelchair Mobility    Modified Rankin (Stroke Patients Only)       Balance Overall balance assessment: Needs assistance Sitting-balance support: Feet supported;No upper extremity supported Sitting balance-Leahy Scale: Good     Standing balance support: Bilateral upper extremity supported Standing balance-Leahy Scale: Good                               Cognition Arousal/Alertness: Awake/alert Behavior During Therapy: WFL for tasks assessed/performed Overall Cognitive Status: Within Functional Limits for tasks assessed                                        Exercises Other Exercises Other Exercises: verbal review HEP    General Comments        Pertinent Vitals/Pain Pain Assessment: Faces Faces Pain Scale: Hurts a little bit Pain Location: R hip Pain Descriptors / Indicators: Operative site guarding Pain Intervention(s): Limited activity within patient's tolerance;Monitored during session;Premedicated before session    Home Living                      Prior Function            PT Goals (current goals can now be found in the care plan section) Progress towards PT goals: Progressing toward goals    Frequency    BID      PT Plan Current plan remains appropriate    Co-evaluation              AM-PAC PT "6 Clicks" Mobility   Outcome Measure  Help needed turning from your back to your side while in a flat bed without using bedrails?: None Help needed moving from lying on your back to  sitting on the side of a flat bed without using bedrails?: None Help needed moving to and from a bed to a chair (including a wheelchair)?: None Help needed standing up from a chair using your arms (e.g., wheelchair or bedside chair)?: None Help needed to walk in hospital room?: A Little Help needed climbing 3-5 steps with a railing? : A Little 6 Click Score: 22    End of Session Equipment Utilized During Treatment: Gait belt Activity Tolerance: Patient tolerated treatment well Patient left: in bed;with call bell/phone within reach Nurse Communication: Mobility status Pain - Right/Left: Right Pain - part of body: Hip     Time: 0850-0907 PT Time Calculation (min) (ACUTE ONLY): 17 min  Charges:  $Gait Training: 8-22 mins                    Danielle Dess, PTA 06/16/20, 9:19 AM

## 2020-06-16 NOTE — Discharge Summary (Signed)
Physician Discharge Summary  Patient ID: Phillip Washington MRN: [id number] DOB/AGE: 07-03-1960 60 y.o.  Admit date: 06/12/2020 Discharge date: 06/16/2020  Admission Diagnoses:  Surgery, elective [Z41.9] Intertrochanteric fracture of right hip (HCC) [S72.141A] Closed fracture of right hip, initial encounter [facility]) [S72.001A]  Discharge Diagnoses: Patient Active Problem List   Diagnosis Date Noted  . Intertrochanteric fracture of right hip (HCC) 06/12/2020    Past Medical History:  Diagnosis Date  . GERD (gastroesophageal reflux disease)   . PONV (postoperative nausea and vomiting)    Transfusion: 1 unit of PRBC transfused on 06/15/20   Consultants (if any): Treatment Team:  Signa Kell, MD  Discharged Condition: Improved  Hospital Course: Phillip Washington is an 60 y.o. male who was admitted 06/12/2020 with a diagnosis of a right intertrochanteric hip fracture and went to the operating room on 06/13/2020 and underwent the above named procedures.    Surgeries: Procedure(s): INTRAMEDULLARY (IM) NAIL INTERTROCHANTRIC on 06/13/2020 Patient tolerated the surgery well. Taken to PACU where he was stabilized and then transferred to the orthopedic floor.  Started on Lovenox 40mg  q 24 hrs. Foot pumps applied bilaterally at 80 mm. Heels elevated on bed with rolled towels. No evidence of DVT. Negative Homan. Physical therapy started on day #1 for gait training and transfer. OT started day #1 for ADL and assisted devices.  On POD2, Hg was 7.1, 1 unit of PRBC was transfused, Hg on 06/16/20 was 7.9.  Patient was able to complete PT without dizziness or issues.  Patient's IV was removed on POD3.  Implants: 06/18/20 & Nephew Trigen IntertanLong Nail: 10x466mm; 30m lag screw with 44mm compression screw; 5x 42.29mm distal cortical interlocking screw  He was given perioperative antibiotics:  Anti-infectives (From admission, onward)   Start     Dose/Rate Route Frequency Ordered Stop   06/13/20 2000  ceFAZolin  (ANCEF) IVPB 2g/100 mL premix        2 g 200 mL/hr over 30 Minutes Intravenous Every 6 hours 06/13/20 1822 06/14/20 1006   06/13/20 1251  ceFAZolin (ANCEF) 2-4 GM/100ML-% IVPB       Note to Pharmacy: 06/15/20   : cabinet override      06/13/20 1251 06/13/20 2158   06/13/20 0000  ceFAZolin (ANCEF) IVPB 2g/100 mL premix        2 g 200 mL/hr over 30 Minutes Intravenous 30 min pre-op 06/12/20 1923 06/13/20 1348    .  He was given sequential compression devices, early ambulation, and Lovenox for DVT prophylaxis.  He benefited maximally from the hospital stay and there were no complications.    Recent vital signs:  Vitals:   06/16/20 0517 06/16/20 0750  BP: 120/76 117/76  Pulse: 85 (!) 102  Resp: 19 17  Temp: 98 F (36.7 C) 98 F (36.7 C)  SpO2: 97% 98%    Recent laboratory studies:  Lab Results  Component Value Date   HGB 7.9 (L) 06/16/2020   HGB 8.1 (L) 06/15/2020   HGB 7.1 (L) 06/15/2020   Lab Results  Component Value Date   WBC 5.0 06/16/2020   PLT 141 (L) 06/16/2020   Lab Results  Component Value Date   INR 1.1 06/12/2020   Lab Results  Component Value Date   NA 138 06/16/2020   K 3.7 06/16/2020   CL 103 06/16/2020   CO2 27 06/16/2020   BUN 12 06/16/2020   CREATININE 0.81 06/16/2020   GLUCOSE 117 (H) 06/16/2020    Discharge Medications:   Allergies as of  06/16/2020   No Known Allergies     Medication List    TAKE these medications   celecoxib 100 MG capsule Commonly known as: CELEBREX Take 100 mg by mouth 2 (two) times daily.   enoxaparin 40 MG/0.4ML injection Commonly known as: LOVENOX Inject 0.4 mLs (40 mg total) into the skin daily for 14 doses.   methocarbamol 500 MG tablet Commonly known as: ROBAXIN Take 1 tablet (500 mg total) by mouth every 6 (six) hours as needed for muscle spasms.   ondansetron 4 MG tablet Commonly known as: ZOFRAN Take 1 tablet (4 mg total) by mouth every 6 (six) hours as needed for nausea.   oxyCODONE 5 MG  immediate release tablet Commonly known as: Oxy IR/ROXICODONE Take 1-2 tablets (5-10 mg total) by mouth every 4 (four) hours as needed for severe pain (pain score 7-10).   traMADol 50 MG tablet Commonly known as: ULTRAM Take 1 tablet (50 mg total) by mouth every 6 (six) hours as needed for moderate pain.       Diagnostic Studies: DG Chest 1 View  Result Date: 06/12/2020 CLINICAL DATA:  Fall EXAM: CHEST  1 VIEW COMPARISON:  None. FINDINGS: The heart size and mediastinal contours are within normal limits. Both lungs are clear. The visualized skeletal structures are unremarkable. IMPRESSION: No active disease. Electronically Signed   By: Jasmine Pang M.D.   On: 06/12/2020 16:48   DG Chest Port 1 View  Result Date: 06/14/2020 CLINICAL DATA:  Elevated temperature EXAM: PORTABLE CHEST 1 VIEW COMPARISON:  06/12/2020 FINDINGS: Shallow inspiration with developing linear atelectasis in the lung bases since prior study. No focal consolidation. No pleural effusions. No pneumothorax. Mediastinal contours appear intact. IMPRESSION: Shallow inspiration with developing linear atelectasis in the lung bases. Electronically Signed   By: Burman Nieves M.D.   On: 06/14/2020 21:14   DG HIP OPERATIVE UNILAT W OR W/O PELVIS RIGHT  Result Date: 06/13/2020 CLINICAL DATA:  Hip fracture EXAM: OPERATIVE right HIP (WITH PELVIS IF PERFORMED) 6 VIEWS TECHNIQUE: Fluoroscopic spot image(s) were submitted for interpretation post-operatively. COMPARISON:  06/12/2020 FINDINGS: Six low resolution intraoperative spot views of the right hip. Total fluoroscopy time was 3.7 seconds. The images demonstrate intramedullary rod and distal screw fixation of comminuted right intertrochanteric fracture. IMPRESSION: Intraoperative fluoroscopic assistance provided during surgical fixation of proximal right femur fracture Electronically Signed   By: Jasmine Pang M.D.   On: 06/13/2020 15:37   DG Hip Unilat  With Pelvis 2-3 Views  Right  Result Date: 06/12/2020 CLINICAL DATA:  Hip pain after fall EXAM: DG HIP (WITH OR WITHOUT PELVIS) 2-3V RIGHT COMPARISON:  None. FINDINGS: SI joints are non widened. Pubic symphysis and rami appear intact. Mild degenerative changes of the left hip. Acute comminuted right intertrochanteric fracture with displaced lesser trochanteric fracture fragment. Advanced arthritis of the right hip. Right femoral head projects in joint IMPRESSION: Acute comminuted right intertrochanteric fracture. Electronically Signed   By: Jasmine Pang M.D.   On: 06/12/2020 16:48   Disposition: Plan for discharge home today pending ability to work with PT this AM.   Follow-up Information    Dedra Skeens, [place]. Schedule an appointment as soon as possible for a visit in 2 week(s).   Specialty: Orthopedic Surgery Why: For staple removal and right hip x-rays Contact information: [address] [place] [address] [phone]              Signed: Meriel Pica PA-C 06/16/2020, 8:05 AM

## 2020-06-16 NOTE — TOC Initial Note (Signed)
Transition of Care [facility]) - Initial/Assessment Note    Patient Details  Name: Phillip Washington MRN: [id number] Date of Birth: 08/19/1960  Transition of Care [facility]) CM/SW Contact:    Phillip Founds, RN Phone Number: 06/16/2020, 11:14 AM  Clinical Narrative:    RNCM attempted to meet with patient in room for assessment however patient remained on phone call. RNCM called patient back a short time later and assessment was completed. Patient lives at home with his significant other, he reports that she or his mother will pick him up. Discussed with patient current recommendations for home health. Patient reports that he currently doesn't have a PCP. Discussed that per our records he does not have insurance either. Patient reports that he thinks he has either Medicaid or Medicare but is not sure which and he will need to be able to get to his insurance card to find out. Discussed that he will need a PCP willing to sign home health orders as well as determination of whether or not he has a payer source. With this information patient reported that he would hold off for now and would follow up with Dr. Joice Washington or Phillip Washington for home health orders once he had everything else straightened out. Patient reports either his girlfriend or mother will pick him up.                Expected Discharge Plan: Home/Self Care Barriers to Discharge: No Barriers Identified   Patient Goals and CMS Choice        Expected Discharge Plan and Services Expected Discharge Plan: Home/Self Care       Living arrangements for the past 2 months: Single Family Home Expected Discharge Date: 06/16/20                                    Prior Living Arrangements/Services Living arrangements for the past 2 months: Single Family Home   Patient language and need for interpreter reviewed:: Yes Do you feel safe going back to the place where you live?: Yes      Need for Family Participation in Patient Care: Yes (Comment) Care giver  support system in place?: Yes (comment)   Criminal Activity/Legal Involvement Pertinent to Current Situation/Hospitalization: No - Comment as needed  Activities of Daily Living Home Assistive Devices/Equipment: None ADL Screening (condition at time of admission) Patient's cognitive ability adequate to safely complete daily activities?: Yes Is the patient deaf or have difficulty hearing?: No Does the patient have difficulty seeing, even when wearing glasses/contacts?: No Does the patient have difficulty concentrating, remembering, or making decisions?: No Patient able to express need for assistance with ADLs?: No Does the patient have difficulty dressing or bathing?: No Independently performs ADLs?: Yes (appropriate for developmental age) Does the patient have difficulty walking or climbing stairs?: No Weakness of Legs: Right Weakness of Arms/Hands: None  Permission Sought/Granted                  Emotional Assessment Appearance:: Appears stated age     Orientation: : Oriented to Self,Oriented to Place,Oriented to  Time,Oriented to Situation Alcohol / Substance Use: Not Applicable Psych Involvement: No (comment)  Admission diagnosis:  Surgery, elective [Z41.9] Intertrochanteric fracture of right hip (HCC) [S72.141A] Closed fracture of right hip, initial encounter [facility]) [S72.001A] Patient Active Problem List   Diagnosis Date Noted  . Intertrochanteric fracture of right hip (HCC) 06/12/2020   PCP:  Patient, No Pcp Per Pharmacy:  No Pharmacies Listed    Social Determinants of Health (SDOH) Interventions    Readmission Risk Interventions No flowsheet data found.

## 2020-06-17 NOTE — Anesthesia Postprocedure Evaluation (Signed)
Anesthesia Post Note  Patient: Phillip Washington  Procedure(s) Performed: INTRAMEDULLARY (IM) NAIL INTERTROCHANTRIC (Right Hip)  Anesthesia Type: Spinal Anesthetic complications: no   No complications documented.   Last Vitals:  Vitals:   06/16/20 0517 06/16/20 0750  BP: 120/76 117/76  Pulse: 85 (!) 102  Resp: 19 17  Temp: 36.7 C 36.7 C  SpO2: 97% 98%    Last Pain:  Vitals:   06/16/20 0900  TempSrc:   PainSc: 5    Pt was not seen post op prior to discharge.  No complications from spinal noted on chart review. He was participating in PT and ambulating before discharge.               Karleen Hampshire

## 2020-06-23 ENCOUNTER — Other Ambulatory Visit (INDEPENDENT_AMBULATORY_CARE_PROVIDER_SITE_OTHER): Payer: Self-pay | Admitting: Nurse Practitioner

## 2020-10-01 ENCOUNTER — Other Ambulatory Visit: Payer: Self-pay | Admitting: Orthopedic Surgery

## 2020-10-07 ENCOUNTER — Other Ambulatory Visit: Admission: RE | Admit: 2020-10-07 | Payer: Medicaid Other | Source: Ambulatory Visit

## 2020-10-14 ENCOUNTER — Other Ambulatory Visit: Payer: Medicaid Other

## 2020-10-16 ENCOUNTER — Inpatient Hospital Stay: Admit: 2020-10-16 | Payer: Medicaid Other | Admitting: Orthopedic Surgery

## 2020-10-16 SURGERY — REMOVAL, HARDWARE
Anesthesia: Choice | Site: Hip | Laterality: Right

## 2023-01-12 IMAGING — DX DG CHEST 1V PORT
1 series · 1 of 1 positions shown · non-contrast
Comparison: 06/12/2020

CLINICAL DATA: Elevated temperature

EXAM:
PORTABLE CHEST 1 VIEW

[chest ap]
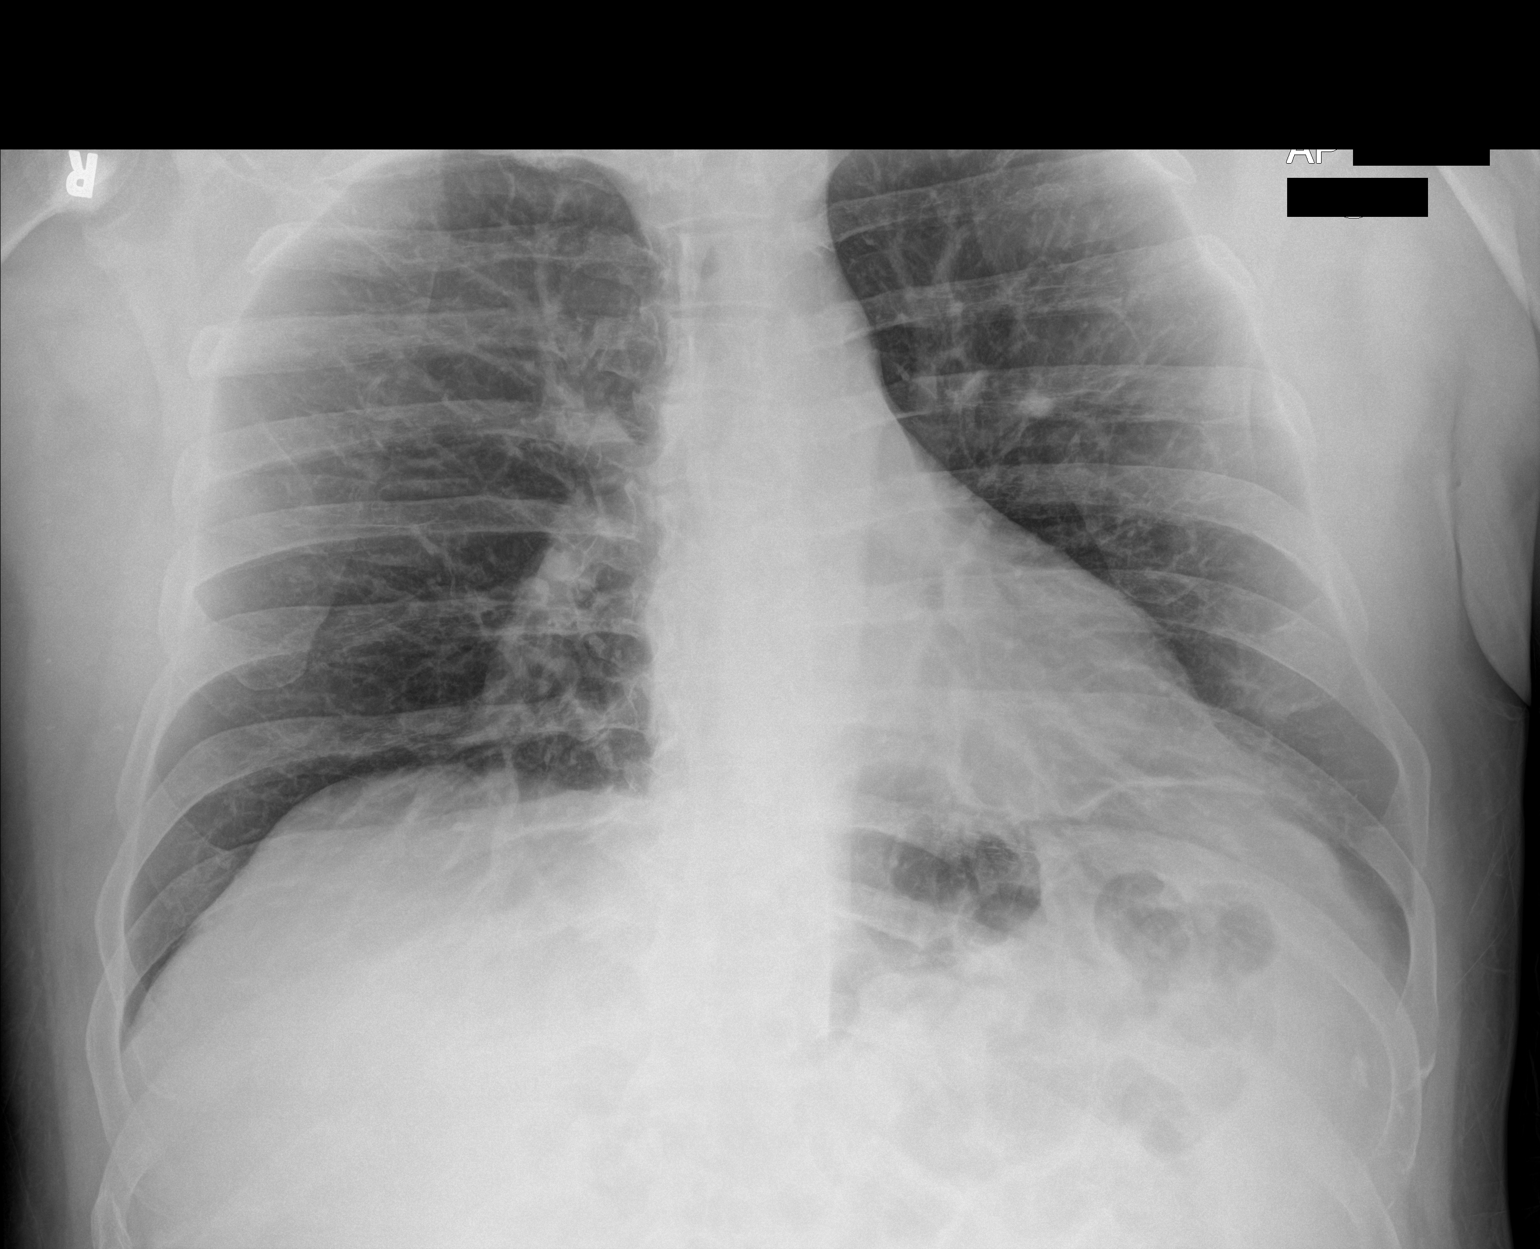

[1 of 1 positions shown; findings below may reference images not displayed]

FINDINGS: Shallow inspiration with developing linear atelectasis in the lung
bases since prior study. No focal consolidation. No pleural
effusions. No pneumothorax. Mediastinal contours appear intact.
IMPRESSION: Shallow inspiration with developing linear atelectasis in the lung
bases.

## 2023-08-13 ENCOUNTER — Emergency Department

## 2023-08-13 ENCOUNTER — Emergency Department
Admission: EM | Admit: 2023-08-13 | Discharge: 2023-08-13 | Disposition: A | Discharge status: Home / Self Care | Attending: Emergency Medicine | Admitting: Emergency Medicine

## 2023-08-13 ENCOUNTER — Encounter: Payer: Self-pay | Admitting: Emergency Medicine

## 2023-08-13 ENCOUNTER — Other Ambulatory Visit: Payer: Self-pay

## 2023-08-13 DIAGNOSIS — J111 Influenza due to unidentified influenza virus with other respiratory manifestations: Secondary | ICD-10-CM | POA: Insufficient documentation

## 2023-08-13 DIAGNOSIS — N3090 Cystitis, unspecified without hematuria: Secondary | ICD-10-CM | POA: Diagnosis not present

## 2023-08-13 DIAGNOSIS — R079 Chest pain, unspecified: Secondary | ICD-10-CM | POA: Diagnosis present

## 2023-08-13 LAB — URINALYSIS, W/ REFLEX TO CULTURE (INFECTION SUSPECTED)
Bilirubin Urine: NEGATIVE
Glucose, UA: NEGATIVE mg/dL
Ketones, ur: NEGATIVE mg/dL
Nitrite: POSITIVE — AB
Protein, ur: 100 mg/dL — AB
RBC / HPF: >50 RBC/hpf (ref 0–5)
Specific Gravity, Urine: 1.029 (ref 1.005–1.030)
WBC, UA: >50 WBC/hpf (ref 0–5)
pH: 5 (ref 5.0–8.0)

## 2023-08-13 LAB — CBC
HCT: 41.5 % (ref 39.0–52.0)
Hemoglobin: 14 g/dL (ref 13.0–17.0)
MCH: 32.6 pg (ref 26.0–34.0)
MCHC: 33.7 g/dL (ref 30.0–36.0)
MCV: 96.5 fL (ref 80.0–100.0)
Platelets: 235 10*3/uL (ref 150–400)
RBC: 4.3 MIL/uL (ref 4.22–5.81)
RDW: 12.8 % (ref 11.5–15.5)
WBC: 8.5 10*3/uL (ref 4.0–10.5)
nRBC: 0 % (ref 0.0–0.2)

## 2023-08-13 LAB — BASIC METABOLIC PANEL
Anion gap: 4 — ABNORMAL LOW (ref 5–15)
BUN: 21 mg/dL — ABNORMAL HIGH (ref 6–20)
CO2: 25 mmol/L (ref 22–32)
Calcium: 9 mg/dL (ref 8.9–10.3)
Chloride: 108 mmol/L (ref 98–111)
Creatinine, Ser: 0.97 mg/dL (ref 0.61–1.24)
GFR, Estimated: >60 mL/min (ref >60)
Glucose, Bld: 144 mg/dL — ABNORMAL HIGH (ref 70–99)
Potassium: 3.6 mmol/L (ref 3.5–5.1)
Sodium: 137 mmol/L (ref 135–145)

## 2023-08-13 LAB — TROPONIN I (HIGH SENSITIVITY)
Troponin I (High Sensitivity): 3 ng/L (ref <18)
Troponin I (High Sensitivity): 3 ng/L (ref <18)

## 2023-08-13 MED ORDER — SODIUM CHLORIDE 0.9 % IV SOLN: Freq: Once | INTRAVENOUS | Status: AC

## 2023-08-13 MED ORDER — METOCLOPRAMIDE HCL 5 MG/ML IJ SOLN
10.0000 mg | INTRAMUSCULAR | Status: AC
  Administered 2023-08-13: 10 mg via INTRAVENOUS
  Filled 2023-08-13: qty 2

## 2023-08-13 MED ORDER — CEPHALEXIN 500 MG PO CAPS: 500.0000 mg | ORAL_CAPSULE | Freq: Three times a day (TID) | ORAL | 0 refills | Status: AC

## 2023-08-13 MED ORDER — SODIUM CHLORIDE 0.9 % IV BOLUS
1000.0000 mL | Freq: Once | INTRAVENOUS | Status: AC
  Administered 2023-08-13: 1000 mL via INTRAVENOUS

## 2023-08-13 MED ORDER — ONDANSETRON 4 MG PO TBDP: 4.0000 mg | ORAL_TABLET | Freq: Three times a day (TID) | ORAL | 0 refills | Status: AC | PRN

## 2023-08-13 MED ORDER — DIPHENHYDRAMINE HCL 50 MG/ML IJ SOLN
25.0000 mg | INTRAMUSCULAR | Status: AC
  Administered 2023-08-13: 25 mg via INTRAVENOUS
  Filled 2023-08-13: qty 1

## 2023-08-13 MED ORDER — NAPROXEN 500 MG PO TABS: 500.0000 mg | ORAL_TABLET | Freq: Two times a day (BID) | ORAL | 0 refills | Status: AC

## 2023-08-13 MED ORDER — KETOROLAC TROMETHAMINE 15 MG/ML IJ SOLN
15.0000 mg | Freq: Once | INTRAMUSCULAR | Status: AC
  Administered 2023-08-13: 15 mg via INTRAVENOUS
  Filled 2023-08-13: qty 1

## 2023-08-13 NOTE — Discharge Instructions (Signed)
 Your urine test shows sign of a bladder infection.  Take Keflex to resolve this.  Your other symptoms are likely to be due to a viral illness.  Take Zofran as needed for nausea and naproxen as needed for pain, headache, and chills.

## 2023-08-13 NOTE — ED Provider Notes (Signed)
 [facility] Provider Note    Event Date/Time   First MD Initiated Contact with Patient 08/13/23 1250     (approximate)   History   Chief Complaint: Chest Pain   HPI  Phillip Washington is a 63 y.o. male with a past history of GERD who comes ED complaining of intermittent central chest pain along with headache, chills, night sweats, fatigue, decreased appetite, all evolving and worsening over the last 2 to 3 days.  Also endorses dysuria and dark urine.  No exertional chest pain.   Outside records reviewed noting recent normal coronary CTA on August 09, 2023  08/09/23 CT heart angiogram: Impression: 1.  No coronary artery plaque or stenosis. 2.  Calcium score: 0. 3.  CTFFR Analysis: CT FFR will not be performed for this study 4.  Scattered sub-6 mm solid pulmonary nodules. If patient has risk factors for lung cancer consider a one-year follow-up CT chest without contrast. Otherwise no follow-up is needed.  CAD-RADS: 0        Physical Exam   Triage Vital Signs: ED Triage Vitals  Encounter Vitals Group     BP 08/13/23 1244 115/80     Systolic BP Percentile --      Diastolic BP Percentile --      Pulse Rate 08/13/23 1244 70     Resp 08/13/23 1244 15     Temp 08/13/23 1244 97.7 F (36.5 C)     Temp Source 08/13/23 1244 Oral     SpO2 08/13/23 1244 97 %     Weight 08/13/23 1245 214 lb (97.1 kg)     Height 08/13/23 1245 6' (1.829 m)     Head Circumference --      Peak Flow --      Pain Score 08/13/23 1245 0     Pain Loc --      Pain Education --      Exclude from Growth Chart --     Most recent vital signs: Vitals:   08/13/23 1244 08/13/23 1338  BP: 115/80 112/66  Pulse: 70 70  Resp: 15 15  Temp: 97.7 F (36.5 C)   SpO2: 97% 96%    General: Awake, no distress.  CV:  Good peripheral perfusion.  Regular rate rhythm Resp:  Normal effort.  Clear to auscultation bilaterally Abd:  No distention.  Soft nontender.  No CVA  tenderness Other:  Somewhat dry oral mucosa   ED Results / Procedures / Treatments   Labs (all labs ordered are listed, but only abnormal results are displayed) Labs Reviewed  BASIC METABOLIC PANEL - Abnormal; Notable for the following components:      Result Value   Glucose, Bld 144 (*)    BUN 21 (*)    Anion gap 4 (*)    All other components within normal limits  URINALYSIS, W/ REFLEX TO CULTURE (INFECTION SUSPECTED) - Abnormal; Notable for the following components:   Color, Urine YELLOW (*)    APPearance CLOUDY (*)    Hgb urine dipstick LARGE (*)    Protein, ur 100 (*)    Nitrite POSITIVE (*)    Leukocytes,Ua MODERATE (*)    Bacteria, UA RARE (*)    All other components within normal limits  URINE CULTURE  CBC  TROPONIN I (HIGH SENSITIVITY)  TROPONIN I (HIGH SENSITIVITY)     EKG Interpreted by me Normal sinus rhythm rate of 71.  Right axis.  Normal intervals.  Normal QRS ST segments and T  waves   RADIOLOGY Chest x-ray interpreted by me, appears normal.  Radiology report reviewed   PROCEDURES:  Procedures   MEDICATIONS ORDERED IN ED: Medications  metoCLOPramide (REGLAN) injection 10 mg (has no administration in time range)  diphenhydrAMINE (BENADRYL) injection 25 mg (has no administration in time range)  0.9 %  sodium chloride infusion (0 mLs Intravenous Stopped 08/13/23 1425)  sodium chloride 0.9 % bolus 1,000 mL (1,000 mLs Intravenous New Bag/Given 08/13/23 1351)  ketorolac (TORADOL) 15 MG/ML injection 15 mg (15 mg Intravenous Given 08/13/23 1338)     IMPRESSION / MDM / ASSESSMENT AND PLAN / ED COURSE  I reviewed the triage vital signs and the nursing notes.  DDx: Viral illness, pneumonia, UTI, anemia, AKI, electrolyte derangement  Patient's presentation is most consistent with acute presentation with potential threat to life or bodily function.    Clinical Course as of 08/13/23 1451  Sat Aug 13, 2023  1325 Patient presents with headache, fatigue,  chills, decreased appetite over the past few days, dark urine recently.  Also endorses dysuria starting last night.  Has some mild chest discomfort as well as nonproductive cough.  Coronary CTA was normal.  Symptoms are noncardiac.  Highly suspicious for a influenza-like illness.  Will check labs, give IV fluids and NSAIDs. [PS]    Clinical Course User Index [PS] Sharman Cheek, MD    ----------------------------------------- 2:51 PM on 08/13/2023 ----------------------------------------- UA does show UTI.  Other labs are normal.  Vital signs remain normal.  Feeling better after fluids and supportive care, stable for discharge.   FINAL CLINICAL IMPRESSION(S) / ED DIAGNOSES   Final diagnoses:  Cystitis  Influenza-like illness     Rx / DC Orders   ED Discharge Orders          Ordered    cephALEXin (KEFLEX) 500 MG capsule  3 times daily        08/13/23 1448    naproxen (NAPROSYN) 500 MG tablet  2 times daily with meals        08/13/23 1448    ondansetron (ZOFRAN-ODT) 4 MG disintegrating tablet  Every 8 hours PRN        08/13/23 1448             Note:  This document was prepared using Dragon voice recognition software and may include unintentional dictation errors.   Sharman Cheek, MD 08/13/23 [phone]

## 2023-08-13 NOTE — ED Triage Notes (Signed)
 First nurse note: Pt reports dark and red colored urine, CP, SOB and fatigue. BP 92/60 at [facility]. Pt also reporting needing kidneys checked.

## 2023-08-13 NOTE — ED Notes (Addendum)
 Dr Scotty Court notified and aware of pt complaint of a headache

## 2023-08-13 NOTE — ED Triage Notes (Signed)
 Pt sent over from [facility]; reports intermittent chest pain w/ sob, and light headedness x weeks, having Chest CTA via [facility] Tuesday of this week but has not been called w/ any results.  Patient experienced another episode this morning, denies any complaints at this time.    Also reports dysuria x 2 days, noticing that his urine is dark.  Denies any flank pain.  Ambulatory to triage, NAD noted at this time.

## 2023-08-16 LAB — URINE CULTURE: Culture: >=100000 — AB

## 2023-08-17 ENCOUNTER — Telehealth: Payer: Self-pay | Admitting: Pharmacist

## 2023-08-17 MED ORDER — SULFAMETHOXAZOLE-TRIMETHOPRIM 800-160 MG PO TABS
1.0000 | ORAL_TABLET | Freq: Two times a day (BID) | ORAL | 0 refills | Status: AC
Start: 2023-08-17 — End: ?

## 2023-08-17 NOTE — Addendum Note (Signed)
 Addended by: Pearletha Furl on: 08/17/2023 01:32 PM   Modules accepted: Orders

## 2023-08-17 NOTE — Telephone Encounter (Signed)
 Phillip Washington called back. Information discussed with him. Will like new Rx send to [facility].  New Rx sent to pharmacy

## 2023-08-17 NOTE — Telephone Encounter (Signed)
 Culture follow up Urinary s/sx during ED visit 3/22 Discharge with Cephalexin 500mg  po BID  Susceptibility   Staphylococcus epidermidis    MIC    CIPROFLOXACIN <=0.5 SENSI... Sensitive    GENTAMICIN <=0.5 SENSI... Sensitive    Inducible Clindamycin NEGATIVE Sensitive    NITROFURANTOIN <=16 SENSIT... Sensitive    OXACILLIN >=4 RESISTANT Resistant    RIFAMPIN <=0.5 SENSI... Sensitive    TETRACYCLINE <=1 SENSITIVE Sensitive    TRIMETH/SULFA <=10 SENSIT... Sensitive    VANCOMYCIN 2 SENSITIVE Sensitive           Susceptibility Comments  Staphylococcus epidermidis  >=100,000 COLONIES/mL STAPHYLOCOCCUS EPIDERMIDIS      Discussed with Dr Fuller Plan Recommendation Bactrim DS po BID x 7 days  No answer. Left HIPA appropriate message to call back. Will try again before shift end.   Magda Muise Rodriguez-Guzman PharmD, BCPS 08/17/2023 1:02 PM
